# Patient Record
Sex: Male | Born: 1990
Health system: Southern US, Community
[De-identification: ages and names within clinical notes are randomized; demographics above are authoritative.]

## PROBLEM LIST (undated history)

## (undated) DIAGNOSIS — E079 Disorder of thyroid, unspecified: Secondary | ICD-10-CM

## (undated) HISTORY — DX: Disorder of thyroid, unspecified: E07.9

---

## 2012-09-02 ENCOUNTER — Encounter: Payer: Self-pay | Admitting: General Practice

## 2012-09-02 ENCOUNTER — Ambulatory Visit (INDEPENDENT_AMBULATORY_CARE_PROVIDER_SITE_OTHER): Payer: 59 | Admitting: General Practice

## 2012-09-02 ENCOUNTER — Ambulatory Visit: Payer: Self-pay | Admitting: Family Medicine

## 2012-09-02 ENCOUNTER — Ambulatory Visit: Payer: 59

## 2012-09-02 VITALS — BP 99/61 | HR 48 | Temp 97.1°F | Ht 70.0 in | Wt 168.5 lb

## 2012-09-02 DIAGNOSIS — E039 Hypothyroidism, unspecified: Secondary | ICD-10-CM

## 2012-09-02 DIAGNOSIS — T148XXA Other injury of unspecified body region, initial encounter: Secondary | ICD-10-CM

## 2012-09-02 LAB — COMPLETE METABOLIC PANEL WITH GFR
AST: 25 U/L (ref 0–37)
BUN: 14 mg/dL (ref 6–23)
Calcium: 9.2 mg/dL (ref 8.4–10.5)
Chloride: 105 mEq/L (ref 96–112)
Creat: 1.05 mg/dL (ref 0.50–1.35)
GFR, Est Non African American: >89 mL/min
Glucose, Bld: 73 mg/dL (ref 70–99)

## 2012-09-02 LAB — THYROID PANEL WITH TSH
T3 Uptake: 36.3 % (ref 22.5–37.0)
T4, Total: 5.7 ug/dL (ref 5.0–12.5)

## 2012-09-02 MED ORDER — IBUPROFEN 600 MG PO TABS: 600.0000 mg | ORAL_TABLET | Freq: Three times a day (TID) | ORAL | Status: DC | PRN

## 2012-09-02 NOTE — Progress Notes (Signed)
  Subjective:    Patient ID: Shawn Lynch, male    DOB: 11-17-1990, 22 y.o.   MRN: [id number]  HPI Patient presents today for follow up on hypothyroidism. Reports taking medication as prescribed. Reports having back spasms and muscle soreness periodically. Feels its due to physical activity (sports and exercising). He reports taking aleve and reduces the discomfort, but doesn't totally alleviate it.     Review of Systems  Constitutional: Negative for fever, chills and fatigue.  Respiratory: Negative for cough, chest tightness and shortness of breath.   Cardiovascular: Negative for chest pain and palpitations.  Gastrointestinal: Negative for abdominal pain.  Genitourinary: Negative for difficulty urinating.  Musculoskeletal: Negative.   Skin: Negative.   Neurological: Negative for dizziness, weakness and headaches.  Psychiatric/Behavioral: Negative.        Objective:   Physical Exam  Constitutional: He is oriented to person, place, and time. He appears well-developed and well-nourished.  HENT:  Head: Normocephalic and atraumatic.  Right Ear: External ear normal.  Left Ear: External ear normal.  Eyes: Conjunctivae are normal.  Neck: Normal range of motion.  Cardiovascular: Normal rate, regular rhythm and normal heart sounds.   Pulmonary/Chest: Breath sounds normal. No respiratory distress. He exhibits no tenderness.  Abdominal: Soft. Bowel sounds are normal.  Neurological: He is alert and oriented to person, place, and time.  Skin: Skin is warm and dry.  Psychiatric: He has a normal mood and affect.          Assessment & Plan:  1. Unspecified hypothyroidism - POCT CBC -labs pending - COMPLETE METABOLIC PANEL WITH GFR - Thyroid Panel With TSH  2. Muscle strain - ibuprofen (ADVIL,MOTRIN) 600 MG tablet; Take 1 tablet (600 mg total) by mouth every 8 (eight) hours as needed for pain.  Dispense: 30 tablet; Refill: 0 -instructed to apply heat pack to affected area three  times daily for 15 minutes, off 45 minutes -RTO if symptoms worsen or unresolved Patient verbalized understanding Coralie Keens, FNP-C

## 2012-09-02 NOTE — Patient Instructions (Addendum)
Hypothyroidism The thyroid is a large gland located in the lower front of your neck. The thyroid gland helps control metabolism. Metabolism is how your body handles food. It controls metabolism with the hormone thyroxine. When this gland is underactive (hypothyroid), it produces too little hormone.  CAUSES These include:   Absence or destruction of thyroid tissue.  Goiter due to iodine deficiency.  Goiter due to medications.  Congenital defects (since birth).  Problems with the pituitary. This causes a lack of TSH (thyroid stimulating hormone). This hormone tells the thyroid to turn out more hormone. SYMPTOMS  Lethargy (feeling as though you have no energy)  Cold intolerance  Weight gain (in spite of normal food intake)  Dry skin  Coarse hair  Menstrual irregularity (if severe, may lead to infertility)  Slowing of thought processes Cardiac problems are also caused by insufficient amounts of thyroid hormone. Hypothyroidism in the newborn is cretinism, and is an extreme form. It is important that this form be treated adequately and immediately or it will lead rapidly to retarded physical and mental development. DIAGNOSIS  To prove hypothyroidism, your caregiver may do blood tests and ultrasound tests. Sometimes the signs are hidden. It may be necessary for your caregiver to watch this illness with blood tests either before or after diagnosis and treatment. TREATMENT  Low levels of thyroid hormone are increased by using synthetic thyroid hormone. This is a safe, effective treatment. It usually takes about four weeks to gain the full effects of the medication. After you have the full effect of the medication, it will generally take another four weeks for problems to leave. Your caregiver may start you on low doses. If you have had heart problems the dose may be gradually increased. It is generally not an emergency to get rapidly to normal. HOME CARE INSTRUCTIONS   Take your  medications as your caregiver suggests. Let your caregiver know of any medications you are taking or start taking. Your caregiver will help you with dosage schedules.  As your condition improves, your dosage needs may increase. It will be necessary to have continuing blood tests as suggested by your caregiver.  Report all suspected medication side effects to your caregiver. SEEK MEDICAL CARE IF: Seek medical care if you develop:  Sweating.  Tremulousness (tremors).  Anxiety.  Rapid weight loss.  Heat intolerance.  Emotional swings.  Diarrhea.  Weakness. SEEK IMMEDIATE MEDICAL CARE IF:  You develop chest pain, an irregular heart beat (palpitations), or a rapid heart beat. MAKE SURE YOU:   Understand these instructions.  Will watch your condition.  Will get help right away if you are not doing well or get worse. Document Released: 03/23/2005 Document Revised: 06/15/2011 Document Reviewed: 11/11/2007 [facility] Patient Information 2014 [place], [place]. Muscle Strain Muscle strain occurs when a muscle is stretched beyond its normal length. A small number of muscle fibers generally are torn. This is especially common in athletes. This happens when a sudden, violent force placed on a muscle stretches it too far. Usually, recovery from muscle strain takes 1 to 2 weeks. Complete healing will take 5 to 6 weeks.  HOME CARE INSTRUCTIONS   While awake, apply ice to the sore muscle for the first 2 days after the injury.  Put ice in a plastic bag.  Place a towel between your skin and the bag.  Leave the ice on for 15-20 minutes each hour.  Do not use the strained muscle for several days, until you no longer have pain.  You  may wrap the injured area with an elastic bandage for comfort. Be careful not to wrap it too tightly. This may interfere with blood circulation or increase swelling.  Only take over-the-counter or prescription medicines for pain, discomfort, or fever as directed  by your caregiver. SEEK MEDICAL CARE IF:  You have increasing pain or swelling in the injured area. MAKE SURE YOU:   Understand these instructions.  Will watch your condition.  Will get help right away if you are not doing well or get worse. Document Released: 03/23/2005 Document Revised: 06/15/2011 Document Reviewed: 04/04/2011 [facility] Patient Information 2014 [place], [place].

## 2012-09-07 ENCOUNTER — Telehealth: Payer: Self-pay | Admitting: Family Medicine

## 2012-09-07 ENCOUNTER — Other Ambulatory Visit: Payer: Self-pay | Admitting: General Practice

## 2012-09-07 MED ORDER — LEVOTHYROXINE SODIUM 137 MCG PO TABS: 137.0000 ug | ORAL_TABLET | Freq: Every day | ORAL | Status: DC

## 2012-09-07 NOTE — Telephone Encounter (Signed)
Please inform patient that dose has been called in to pharmacy. Have him schedule appointment for lab draw in 6 weeks. thx

## 2012-09-07 NOTE — Telephone Encounter (Signed)
Spoke with patient.  He has been out of levothyroxine for 3 days.  Explained that TSH was slightly elevated and dosage may need to be increased. I will have Philomena Doheny, NP review the labs and make any necessary changes.  We will contact him today with recommendations.

## 2012-09-07 NOTE — Telephone Encounter (Signed)
Notified patient of dosage change.  Appt scheduled for repeat labs in 6 weeks.

## 2012-10-19 ENCOUNTER — Other Ambulatory Visit (INDEPENDENT_AMBULATORY_CARE_PROVIDER_SITE_OTHER): Payer: 59

## 2012-10-19 DIAGNOSIS — R7989 Other specified abnormal findings of blood chemistry: Secondary | ICD-10-CM

## 2012-10-19 LAB — THYROID PANEL WITH TSH: T4, Total: 5.7 ug/dL (ref 5.0–12.5)

## 2012-10-20 ENCOUNTER — Other Ambulatory Visit: Payer: Self-pay | Admitting: Nurse Practitioner

## 2012-10-20 MED ORDER — LEVOTHYROXINE SODIUM 150 MCG PO TABS: 150.0000 ug | ORAL_TABLET | Freq: Every day | ORAL | Status: DC

## 2012-11-01 ENCOUNTER — Ambulatory Visit: Payer: 59 | Admitting: Physician Assistant

## 2012-11-07 ENCOUNTER — Ambulatory Visit (INDEPENDENT_AMBULATORY_CARE_PROVIDER_SITE_OTHER): Payer: 59

## 2012-11-07 ENCOUNTER — Ambulatory Visit (INDEPENDENT_AMBULATORY_CARE_PROVIDER_SITE_OTHER): Payer: 59 | Admitting: Family Medicine

## 2012-11-07 ENCOUNTER — Telehealth: Payer: Self-pay | Admitting: General Practice

## 2012-11-07 ENCOUNTER — Encounter: Payer: Self-pay | Admitting: Family Medicine

## 2012-11-07 VITALS — BP 122/66 | HR 51 | Temp 96.7°F | Ht 69.5 in | Wt 166.2 lb

## 2012-11-07 DIAGNOSIS — S6980XA Other specified injuries of unspecified wrist, hand and finger(s), initial encounter: Secondary | ICD-10-CM

## 2012-11-07 DIAGNOSIS — S6991XA Unspecified injury of right wrist, hand and finger(s), initial encounter: Secondary | ICD-10-CM

## 2012-11-07 NOTE — Patient Instructions (Signed)
Thumb Fracture  There are many types of thumb fractures (breaks). There are different ways of treating these fractures, all of which may be correct, varying from case to case. Your caregiver will discuss different ways to treat these fractures with you. TREATMENT   Immobilization. This means the fracture is casted as it is without changing the positions of the fracture (bone pieces) involved. This fracture is casted in a "thumb spica" also called a hitchhiker cast. It is generally left on for 2 to 6 weeks.  Closed reduction. The bones are manipulated back into position without using surgery.  ORIF (open reduction and internal fixation). The fracture site is opened and the bone pieces are fixed into place with some type of hardware such as screws or wires. Your caregiver will discuss the type of fracture you have and the treatment that will be best for that problem. If surgery is the treatment of choice, the following is information for you to know and to let your caregiver know about prior to surgery. LET YOUR CAREGIVERS KNOW ABOUT:  Allergies.  Medications taken including herbs, eye drops, over the counter medications, and creams.  Use of steroids (by mouth or creams).  Previous problems with anesthetics or Novocain.  Family history of anesthetic complications..  Possibility of pregnancy, if this applies.  History of blood clots (thrombophlebitis).  History of bleeding or blood problems.  Previous surgery.  Other health problems. AFTER THE PROCEDURE  After surgery, you will be taken to the recovery area. A nurse will watch and check your progress. Once you are awake, stable, and taking fluids well, barring other problems you will be allowed to go home. Once home, an ice pack applied to your operative site may help with discomfort and keep the swelling down. Elevate your hand above your heart as much as possible for the first 4-5 days after the injury/surgery. HOME CARE INSTRUCTIONS     Follow your caregiver's instructions as to activities, exercises, physical therapy, and driving a car.  Use thumb and exercise as directed.  Only take over-the-counter or prescription medicines for pain, discomfort, or fever as directed by your caregiver. Do not take aspirin until your caregiver instructs. This can increase bleeding immediately following surgery. SEEK MEDICAL CARE IF:   There is increased bleeding (more than a small spot) from the wound or from beneath your cast or splint.  There is redness, swelling, or increasing pain in the wound or from beneath your cast or splint.  You have pus coming from wound or from beneath your cast or splint.  An unexplained oral temperature above 102 F (38.9 C) develops.  There is a foul smell coming from the wound or dressing or from beneath your cast or splint. SEEK IMMEDIATE MEDICAL CARE IF:   You develop severe pain, decreased sensation such as numbness or tingling.  You develop a rash.  You have difficulty breathing.  Youhave any allergic problems. If you do not have a window in your cast for observing the wound, a discharge or minor bleeding may show up as a stain on the outside of your cast. Report these findings to your caregiver. If you have a removable splint overlying the surgical dressings it is common to see a small amount of bleeding. Change the dressings as instructed by your caregiver. Document Released: 12/20/2002 Document Revised: 06/15/2011 Document Reviewed: 08/01/2007 [employer] Patient Information 2014 [employer].  

## 2012-11-07 NOTE — Progress Notes (Signed)
  Subjective:    Patient ID: Shawn Lynch, male    DOB: 1991/02/27, 22 y.o.   MRN: [id number]  HPI This 22 y.o. male presents for evaluation of right thumb injury.  Report from xray done earlier Shows he has a proximal thumb fx and he has been having discomfort and swelling.  He was Playing soccer and fell on his right hand and his thumb bent all the way back.   Review of Systems No chest pain, SOB, HA, dizziness, vision change, N/V, diarrhea, constipation, dysuria, urinary urgency or frequency, myalgias, arthralgias or rash.     Objective:   Physical Exam Vital signs noted  Well developed well nourished male.  HEENT - Head atraumatic Normocephalic                Eyes - PERRLA, Conjuctiva - clear Sclera- Clear EOMI                Ears - EAC's Wnl TM's Wnl Gross Hearing WNL                Nose - Nares patent                 Throat - oropharanx wnl Respiratory - Lungs CTA bilateral Cardiac - RRR S1 and S2 without murmur MS - Right thumb swelling and ecchymosis at thenar eminence, TTP Proximal phalanx And MTP joint.       Assessment & Plan:  Thumb injury, right, initial encounter - Plan: DG Finger Thumb Right  Fracture Right Thumb  - Spica splint applied right thumb and orthopedic consult.

## 2012-11-07 NOTE — Telephone Encounter (Signed)
appt made

## 2012-11-09 ENCOUNTER — Telehealth: Payer: Self-pay | Admitting: General Practice

## 2012-11-09 NOTE — Telephone Encounter (Signed)
Rx ready, pt aware.

## 2012-11-21 ENCOUNTER — Telehealth: Payer: Self-pay | Admitting: General Practice

## 2012-11-21 ENCOUNTER — Ambulatory Visit (INDEPENDENT_AMBULATORY_CARE_PROVIDER_SITE_OTHER): Payer: 59 | Admitting: Nurse Practitioner

## 2012-11-21 ENCOUNTER — Ambulatory Visit (INDEPENDENT_AMBULATORY_CARE_PROVIDER_SITE_OTHER): Payer: 59

## 2012-11-21 ENCOUNTER — Encounter: Payer: Self-pay | Admitting: Nurse Practitioner

## 2012-11-21 VITALS — BP 120/73 | HR 54 | Temp 97.0°F | Ht 69.5 in | Wt 174.0 lb

## 2012-11-21 DIAGNOSIS — E039 Hypothyroidism, unspecified: Secondary | ICD-10-CM

## 2012-11-21 DIAGNOSIS — R110 Nausea: Secondary | ICD-10-CM

## 2012-11-21 DIAGNOSIS — R0602 Shortness of breath: Secondary | ICD-10-CM

## 2012-11-21 LAB — POCT CBC
Hemoglobin: 14.8 g/dL (ref 14.1–18.1)
Lymph, poc: 2.1 (ref 0.6–3.4)
MCH, POC: 27.8 pg (ref 27–31.2)
MCHC: 34 g/dL (ref 31.8–35.4)
MPV: 8.2 fL (ref 0–99.8)
POC Granulocyte: 4.3 (ref 2–6.9)
POC LYMPH PERCENT: 31.4 %L (ref 10–50)
Platelet Count, POC: 159 10*3/uL (ref 142–424)
RBC: 5.3 M/uL (ref 4.69–6.13)

## 2012-11-21 MED ORDER — OMEPRAZOLE 20 MG PO CPDR: 20.0000 mg | DELAYED_RELEASE_CAPSULE | Freq: Every day | ORAL | Status: DC

## 2012-11-21 NOTE — Patient Instructions (Signed)
Gastroesophageal Reflux Disease, Adult  Gastroesophageal reflux disease (GERD) happens when acid from your stomach flows up into the esophagus. When acid comes in contact with the esophagus, the acid causes soreness (inflammation) in the esophagus. Over time, GERD may create small holes (ulcers) in the lining of the esophagus.  CAUSES   · Increased body weight. This puts pressure on the stomach, making acid rise from the stomach into the esophagus.  · Smoking. This increases acid production in the stomach.  · Drinking alcohol. This causes decreased pressure in the lower esophageal sphincter (valve or ring of muscle between the esophagus and stomach), allowing acid from the stomach into the esophagus.  · Late evening meals and a full stomach. This increases pressure and acid production in the stomach.  · A malformed lower esophageal sphincter.  Sometimes, no cause is found.  SYMPTOMS   · Burning pain in the lower part of the mid-chest behind the breastbone and in the mid-stomach area. This may occur twice a week or more often.  · Trouble swallowing.  · Sore throat.  · Dry cough.  · Asthma-like symptoms including chest tightness, shortness of breath, or wheezing.  DIAGNOSIS   Your caregiver may be able to diagnose GERD based on your symptoms. In some cases, X-rays and other tests may be done to check for complications or to check the condition of your stomach and esophagus.  TREATMENT   Your caregiver may recommend over-the-counter or prescription medicines to help decrease acid production. Ask your caregiver before starting or adding any new medicines.   HOME CARE INSTRUCTIONS   · Change the factors that you can control. Ask your caregiver for guidance concerning weight loss, quitting smoking, and alcohol consumption.  · Avoid foods and drinks that make your symptoms worse, such as:  · Caffeine or alcoholic drinks.  · Chocolate.  · Peppermint or mint flavorings.  · Garlic and onions.  · Spicy foods.  · Citrus fruits,  such as oranges, lemons, or limes.  · Tomato-based foods such as sauce, chili, salsa, and pizza.  · Fried and fatty foods.  · Avoid lying down for the 3 hours prior to your bedtime or prior to taking a nap.  · Eat small, frequent meals instead of large meals.  · Wear loose-fitting clothing. Do not wear anything tight around your waist that causes pressure on your stomach.  · Raise the head of your bed 6 to 8 inches with wood blocks to help you sleep. Extra pillows will not help.  · Only take over-the-counter or prescription medicines for pain, discomfort, or fever as directed by your caregiver.  · Do not take aspirin, ibuprofen, or other nonsteroidal anti-inflammatory drugs (NSAIDs).  SEEK IMMEDIATE MEDICAL CARE IF:   · You have pain in your arms, neck, jaw, teeth, or back.  · Your pain increases or changes in intensity or duration.  · You develop nausea, vomiting, or sweating (diaphoresis).  · You develop shortness of breath, or you faint.  · Your vomit is green, yellow, black, or looks like coffee grounds or blood.  · Your stool is red, bloody, or black.  These symptoms could be signs of other problems, such as heart disease, gastric bleeding, or esophageal bleeding.  MAKE SURE YOU:   · Understand these instructions.  · Will watch your condition.  · Will get help right away if you are not doing well or get worse.  Document Released: 12/31/2004 Document Revised: 06/15/2011 Document Reviewed: 10/10/2010  [employer]® Patient   Information ©2014 [employer].

## 2012-11-21 NOTE — Progress Notes (Signed)
  Subjective:    Patient ID: Maceo Pro, male    DOB: 12/14/1990, 22 y.o.   MRN: [id number]  HPI Patient in c/o nausea for the last 2-3 weeks- Usually occurs in the morning and at night- eating doesn't help nausea- some things can make it worse- like greasy foods make it worse. SOB for the last 4 days- feels like he just can't get enough air- feels like heaviness on his chest.    Review of Systems  Constitutional: Negative for fever, appetite change and fatigue.  Respiratory: Positive for shortness of breath.   Cardiovascular: Positive for chest pain.  Gastrointestinal: Positive for nausea.  Genitourinary: Negative.   Musculoskeletal: Negative.   Skin: Negative.        Objective:   Physical Exam  Constitutional: He appears well-developed and well-nourished.  Cardiovascular: Normal rate, regular rhythm and normal heart sounds.   Pulmonary/Chest: Effort normal and breath sounds normal. No respiratory distress. He has no wheezes. He has no rales. He exhibits no tenderness.  Abdominal: Soft. Bowel sounds are normal.  Neurological: He is alert.  Skin: Skin is warm.   BP 120/73  Pulse 54  Temp(Src) 97 F (36.1 C) (Oral)  Ht 5' 9.5" (1.765 m)  Wt 174 lb (78.926 kg)  BMI 25.34 kg/m2  SpO2 98% ekg- sinus bradycardia- patient is a runner- Human resources officer, FNP  .chest x ray- wnl-Preliminary reading by Paulene Floor, FNP  [facility] CBC- WNL        Assessment & Plan:  1. SOB (shortness of breath) DEEP BREATHING EXERCISES - DG Chest 2 View; Future - EKG 12-Lead 2. Nausea alone AVOID SPICY AND FATTY FOODS WILL TALK ONCE LABS ARE BACK - POCT CBC - BMP8+EGFR - H Pylori, IGM, IGG, IGA AB  Mary-Margaret Daphine Deutscher, FNP

## 2012-11-21 NOTE — Telephone Encounter (Signed)
appt given for today 

## 2012-11-24 LAB — BMP8+EGFR
CO2: 28 mmol/L (ref 18–29)
Calcium: 9.6 mg/dL (ref 8.7–10.2)
Creatinine, Ser: 1.05 mg/dL (ref 0.76–1.27)
GFR calc non Af Amer: 102 mL/min/{1.73_m2} (ref >59)
Glucose: 87 mg/dL (ref 65–99)
Potassium: 4.5 mmol/L (ref 3.5–5.2)
Sodium: 139 mmol/L (ref 134–144)

## 2012-11-24 LAB — H PYLORI, IGM, IGG, IGA AB: H Pylori IgG: 0.9 U/mL — ABNORMAL HIGH (ref 0.0–0.8)

## 2012-11-28 ENCOUNTER — Encounter: Payer: Self-pay | Admitting: *Deleted

## 2013-03-06 ENCOUNTER — Ambulatory Visit: Payer: 59 | Admitting: General Practice

## 2013-03-10 ENCOUNTER — Ambulatory Visit: Payer: 59 | Admitting: Family Medicine

## 2013-06-26 ENCOUNTER — Other Ambulatory Visit: Payer: Self-pay | Admitting: Nurse Practitioner

## 2013-06-27 NOTE — Telephone Encounter (Signed)
Patient NTBS for follow up and lab work  

## 2013-06-27 NOTE — Telephone Encounter (Signed)
Thyroid panel 7/14 was suppose to return in 6 weeks. Did not come back for labs. Please review.

## 2013-06-28 ENCOUNTER — Other Ambulatory Visit: Payer: Self-pay | Admitting: *Deleted

## 2013-06-28 NOTE — Telephone Encounter (Signed)
Patient NTBS for follow up and lab work - no refill on levothyroxin until seen for labs

## 2013-06-28 NOTE — Telephone Encounter (Signed)
Patient last had TSH drawn on 10-19-12. Was to return in 6 weeks for a recheck but not done. Please advise on rf

## 2013-06-29 NOTE — Telephone Encounter (Signed)
Patient needs to have labwork before running out of medication though otherwise the labwork will not be accurate.  Tried to contact patient but there was no answer and his voicemail was not setup. Will attempt again later.

## 2013-07-03 ENCOUNTER — Telehealth: Payer: Self-pay | Admitting: Family Medicine

## 2013-07-03 MED ORDER — LEVOTHYROXINE SODIUM 150 MCG PO TABS: 150.0000 ug | ORAL_TABLET | Freq: Every day | ORAL | Status: DC

## 2013-07-03 NOTE — Telephone Encounter (Signed)
done

## 2013-08-03 ENCOUNTER — Ambulatory Visit (INDEPENDENT_AMBULATORY_CARE_PROVIDER_SITE_OTHER): Payer: 59 | Admitting: Nurse Practitioner

## 2013-08-03 ENCOUNTER — Ambulatory Visit (INDEPENDENT_AMBULATORY_CARE_PROVIDER_SITE_OTHER): Payer: 59

## 2013-08-03 ENCOUNTER — Encounter: Payer: Self-pay | Admitting: *Deleted

## 2013-08-03 ENCOUNTER — Encounter: Payer: Self-pay | Admitting: Nurse Practitioner

## 2013-08-03 VITALS — BP 114/69 | HR 54 | Temp 97.2°F | Ht 70.0 in | Wt 152.0 lb

## 2013-08-03 DIAGNOSIS — S6990XA Unspecified injury of unspecified wrist, hand and finger(s), initial encounter: Secondary | ICD-10-CM

## 2013-08-03 DIAGNOSIS — S0011XA Contusion of right eyelid and periocular area, initial encounter: Secondary | ICD-10-CM

## 2013-08-03 DIAGNOSIS — S6991XA Unspecified injury of right wrist, hand and finger(s), initial encounter: Secondary | ICD-10-CM

## 2013-08-03 DIAGNOSIS — S62309A Unspecified fracture of unspecified metacarpal bone, initial encounter for closed fracture: Secondary | ICD-10-CM

## 2013-08-03 DIAGNOSIS — S0510XA Contusion of eyeball and orbital tissues, unspecified eye, initial encounter: Secondary | ICD-10-CM

## 2013-08-03 DIAGNOSIS — S62306A Unspecified fracture of fifth metacarpal bone, right hand, initial encounter for closed fracture: Secondary | ICD-10-CM

## 2013-08-03 NOTE — Patient Instructions (Signed)
Hand Fracture, Fifth Metacarpal The small metacarpal is the bone at the base of the little finger between the knuckle and the wrist. A fracture is a break in that bone. One of the fractures that is common to this bone is called a Boxer's Fracture. TREATMENT These fractures can be treated with:   Reduction (bones moved back into place), then pinned through the skin to maintain the position, and then casted for about 6 weeks or as your caregiver determines necessary.  ORIF (open reduction and internal fixation) - the fracture site is opened and the bone pieces are fixed into place with pins and then casted for approximately 6 weeks or as your caregiver determines necessary. Your caregiver will discuss the type of fracture you have and the treatment that should be best for that problem. If surgery is the treatment of choice, the following is information for you to know, and also let your caregiver know about prior to surgery.  LET YOUR CAREGIVER KNOW ABOUT:  Allergies.  Medications taken including herbs, eye drops, over the counter medications, and creams.  Use of steroids (by mouth or creams).  Previous problems with anesthetics or novocaine.  Possibility of pregnancy, if this applies.  History of blood clots (thrombophlebitis).  History of bleeding or blood problems.  Previous surgery.  Other health problems. AFTER THE PROCEDURE After surgery, you will be taken to the recovery area where a nurse will watch and check your progress. Once you're awake, stable, and taking fluids well, barring other problems you'll be allowed to go home. Once home an ice pack applied to your operative site may help with discomfort and keep the swelling down. HOME CARE INSTRUCTIONS   Follow your caregiver's instructions as to activities, exercises, physical therapy, and driving a car.  Daily exercise is helpful for maintaining range of motion (movement and mobility) and strength. Exercise as  instructed.  To lessen swelling, keep the injured hand elevated above the level of your heart as much as possible.  Apply ice to the injury for 15-20 minutes each hour while awake for the first 2 days. Put the ice in a plastic bag and place a thin towel between the bag of ice and your cast.  Move the fingers of your casted hand at least several times a day.  If a plaster or fiberglass cast was applied:  Do not try to scratch the skin under the cast using a sharp or pointed object.  Check the skin around the cast every day. You may put lotion on red or sore areas.  Keep your cast dry. Your cast can be protected during bathing with a plastic bag. Do not put your cast into the water.  If a plaster splint was applied:  Wear the splint for as long as directed by your caregiver or until seen for follow-up examination.  Do not get your splint wet. Protect it during bathing with a plastic bag.  You may loosen the elastic bandage around the splint if your fingers start to get numb, tingle, get cold or turn blue.  Do not put pressure on your cast or splint; this may cause it to break. Especially, do not lean plaster casts on hard surfaces for 24 hours after application.  Take medications as directed by your caregiver.  Only take over-the-counter or prescription medicines for pain, discomfort, or fever as directed by your caregiver.  Follow all instructions for physician referrals, physical therapy, and rehabilitation. Any delay in obtaining necessary care could result in   take over-the-counter or prescription medicines for pain, discomfort, or fever as directed by your caregiver.   Follow all instructions for physician referrals, physical therapy, and rehabilitation. Any delay in obtaining necessary care could result in permanent injury, disability and chronic pain.  SEEK MEDICAL CARE IF:    Increased bleeding (more than a small spot) from the wound or from beneath your cast or splint if there is a wound beneath the cast from surgery.   Redness, swelling, or increasing pain in the wound or from beneath your cast or splint.   Pus coming from wound or from beneath your cast or splint.   An unexplained oral temperature above 102 F (38.9 C)  develops.   A foul smell coming from the wound or dressing or from beneath your cast or splint.   You are unable to move your little finger.  SEEK IMMEDIATE MEDICAL CARE IF:   You develop a rash, have difficulty breathing, or have any allergy problems.  If you do not have a window in your cast for observing the wound, a discharge or minor bleeding may show up as a stain on the outside of your cast. Report these findings to your caregiver.  MAKE SURE YOU:    Understand these instructions.   Will watch your condition.   Will get help right away if you are not doing well or get worse.  Document Released: 06/29/2000 Document Revised: 06/15/2011 Document Reviewed: 11/10/2007  [employer] Patient Information 2014 [employer].

## 2013-08-03 NOTE — Progress Notes (Signed)
   Subjective:    Patient ID: Shawn Lynch, male    DOB: 16-Jun-1990, 23 y.o.   MRN: [id number]  HPI Was playing basketball yesterday and went up for a lay up and fell and hit his face and right hand on floor. Has black eye and hand hurts to move.    Review of Systems  Constitutional: Negative.   HENT: Negative.   Eyes: Negative for photophobia and visual disturbance.  Respiratory: Negative.   Neurological: Negative for dizziness, light-headedness and headaches.  All other systems reviewed and are negative.      Objective:   Physical Exam  Constitutional: He appears well-developed and well-nourished.  Eyes:  Right upper and lower lid echymosis with edema  Cardiovascular: Normal rate, regular rhythm and normal heart sounds.   Pulmonary/Chest: Effort normal and breath sounds normal.  Musculoskeletal:  Pain in right hand on palpation at base of 5th finger- pain on full extension og fingers.  Skin: Skin is warm and dry.  Psychiatric: He has a normal mood and affect. His behavior is normal. Judgment and thought content normal.    BP 114/69  Pulse 54  Temp(Src) 97.2 F (36.2 C) (Oral)  Ht 5\' 10"  (1.778 m)  Wt 152 lb (68.947 kg)  BMI 21.81 kg/m2  Right hand x ray- displaced fracture of distal head of 5th Ned Gracemetacarpal-Mary-Margaret Gray Maugeri, FNP      Assessment & Plan:   1. Injury of right hand   2. Fracture of fifth metacarpal bone of right hand   displaced 3. echymosis of right eyelid   Orders Placed This Encounter  Procedures  . DG Hand Complete Right    Standing Status: Future     Number of Occurrences: 1     Standing Expiration Date: 10/03/2014    Order Specific Question:  Reason for Exam (SYMPTOM  OR DIAGNOSIS REQUIRED)    Answer:  right hand injury    Order Specific Question:  Preferred imaging location?    Answer:  Internal  . Ambulatory referral to Orthopedic Surgery    Referral Priority:  Routine    Referral Type:  Surgical    Referral Reason:  Specialty  Services Required    Requested Specialty:  Orthopedic Surgery    Number of Visits Requested:  1   Wear splint that was applied ( Alumnafoam ) until seen by ortho Motrin OTC Q 6 for pain Ice to black eye Mary-Margaret Daphine DeutscherMartin, FNP

## 2013-10-23 ENCOUNTER — Encounter: Payer: Self-pay | Admitting: Family Medicine

## 2013-10-23 ENCOUNTER — Ambulatory Visit (INDEPENDENT_AMBULATORY_CARE_PROVIDER_SITE_OTHER): Payer: 59 | Admitting: Family Medicine

## 2013-10-23 ENCOUNTER — Encounter (INDEPENDENT_AMBULATORY_CARE_PROVIDER_SITE_OTHER): Payer: Self-pay

## 2013-10-23 VITALS — BP 115/66 | HR 55 | Temp 96.6°F | Ht 70.0 in | Wt 178.6 lb

## 2013-10-23 DIAGNOSIS — K5289 Other specified noninfective gastroenteritis and colitis: Secondary | ICD-10-CM

## 2013-10-23 NOTE — Progress Notes (Signed)
   Subjective:    Patient ID: Shawn Lynch, male    DOB: 11-Mar-1991, 23 y.o.   MRN: [id number]  HPI  Had AGE over Weekend and missed work on 10/22/13 and needs work note. He states sx's have resolved  Review of Systems    No chest pain, SOB, HA, dizziness, vision change, N/V, diarrhea, constipation, dysuria, urinary urgency or frequency, myalgias, arthralgias or rash.  Objective:   Physical Exam Vital signs noted  Well developed well nourished male.  HEENT - Head atraumatic Normocephalic                Eyes - PERRLA, Conjuctiva - clear Sclera- Clear EOMI                Ears - EAC's Wnl TM's Wnl Gross Hearing WNL                Nose - Nares patent                 Throat - oropharanx wnl Respiratory - Lungs CTA bilateral Cardiac - RRR S1 and S2 without murmur GI - Abdomen soft Nontender and bowel sounds active x 4 Extremities - No edema. Neuro - Grossly intact.       Assessment & Plan:  Gastroenteritis - Push po fluids, rest and note for work given.  Deatra CanterWilliam J Shayona Hibbitts FNP

## 2013-10-24 ENCOUNTER — Other Ambulatory Visit: Payer: Self-pay | Admitting: Nurse Practitioner

## 2013-11-02 ENCOUNTER — Other Ambulatory Visit: Payer: Self-pay | Admitting: Nurse Practitioner

## 2013-11-02 ENCOUNTER — Telehealth: Payer: Self-pay | Admitting: Nurse Practitioner

## 2013-11-02 MED ORDER — LEVOTHYROXINE SODIUM 150 MCG PO TABS: 150.0000 ug | ORAL_TABLET | Freq: Every day | ORAL | Status: DC

## 2013-11-28 IMAGING — CR DG CHEST 2V
2 series · 2 of 2 positions shown · non-contrast
Comparison: None.

CLINICAL DATA: Shortness of breath

CHEST - 2 VIEW

[view not recorded (1 of 2)]
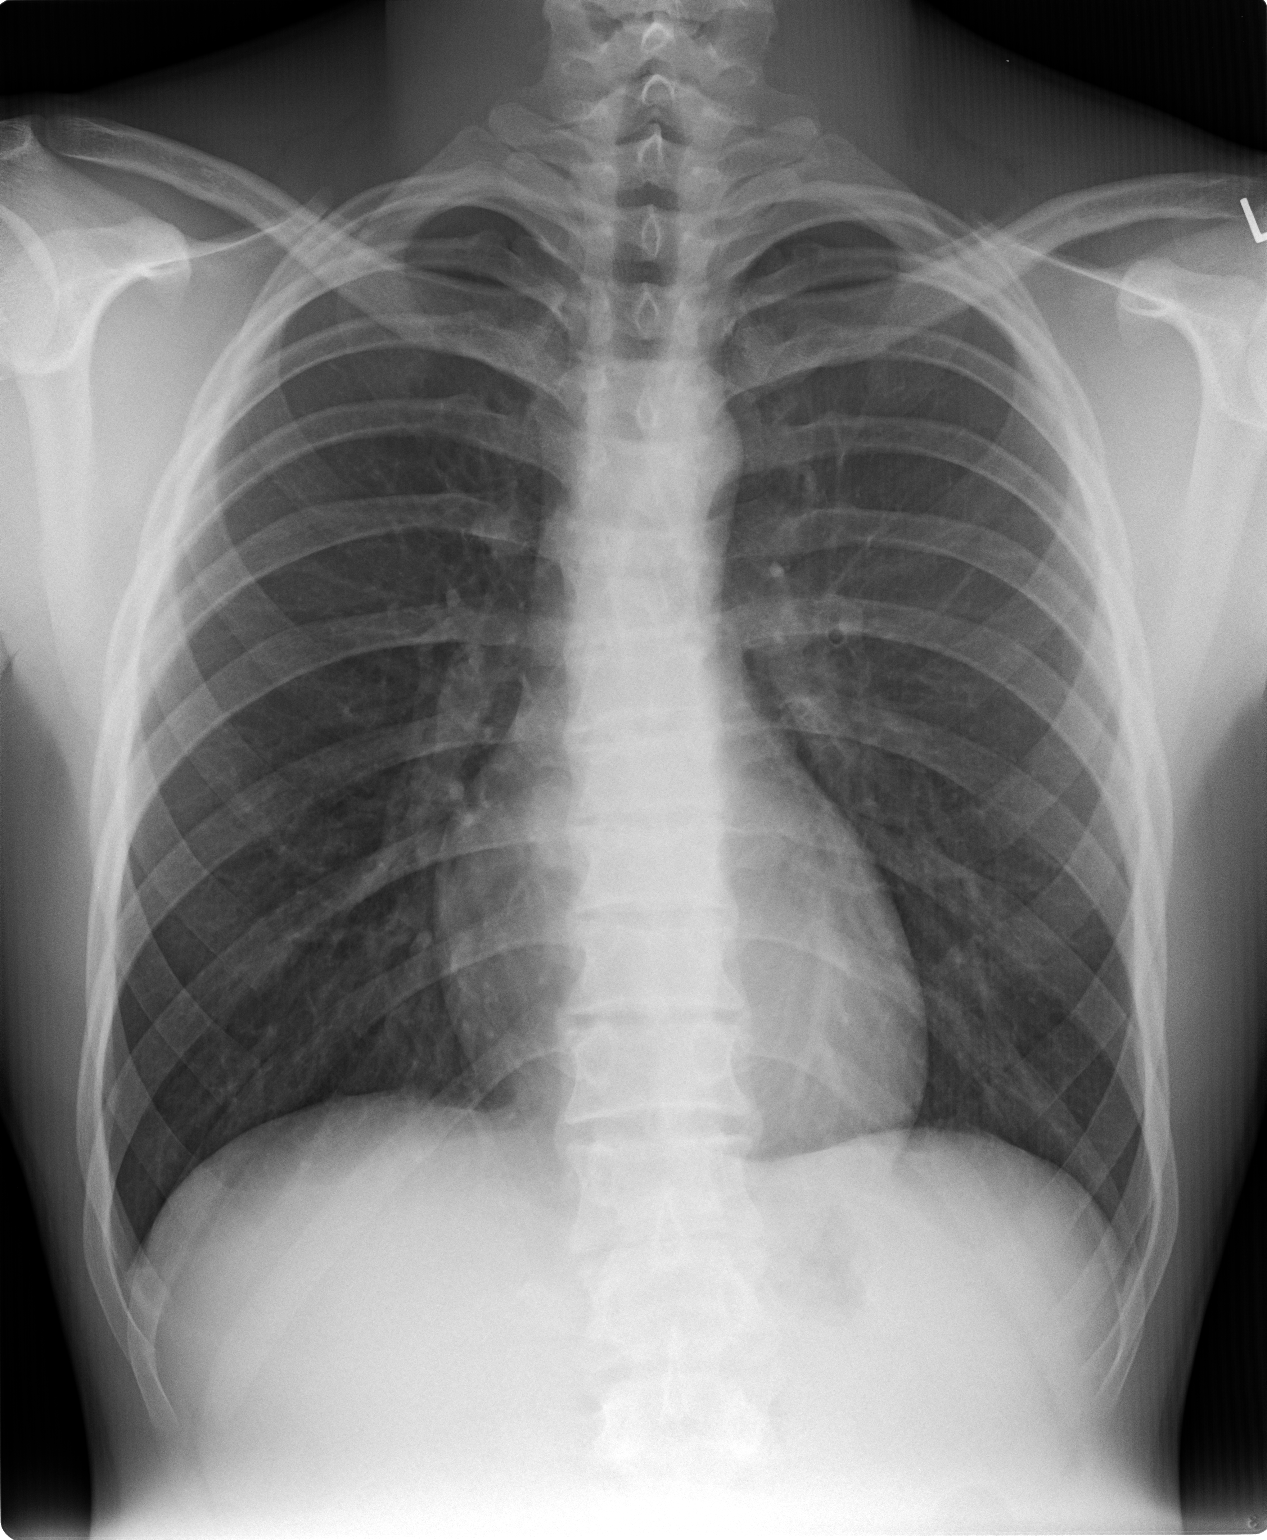

[view not recorded (2 of 2)]
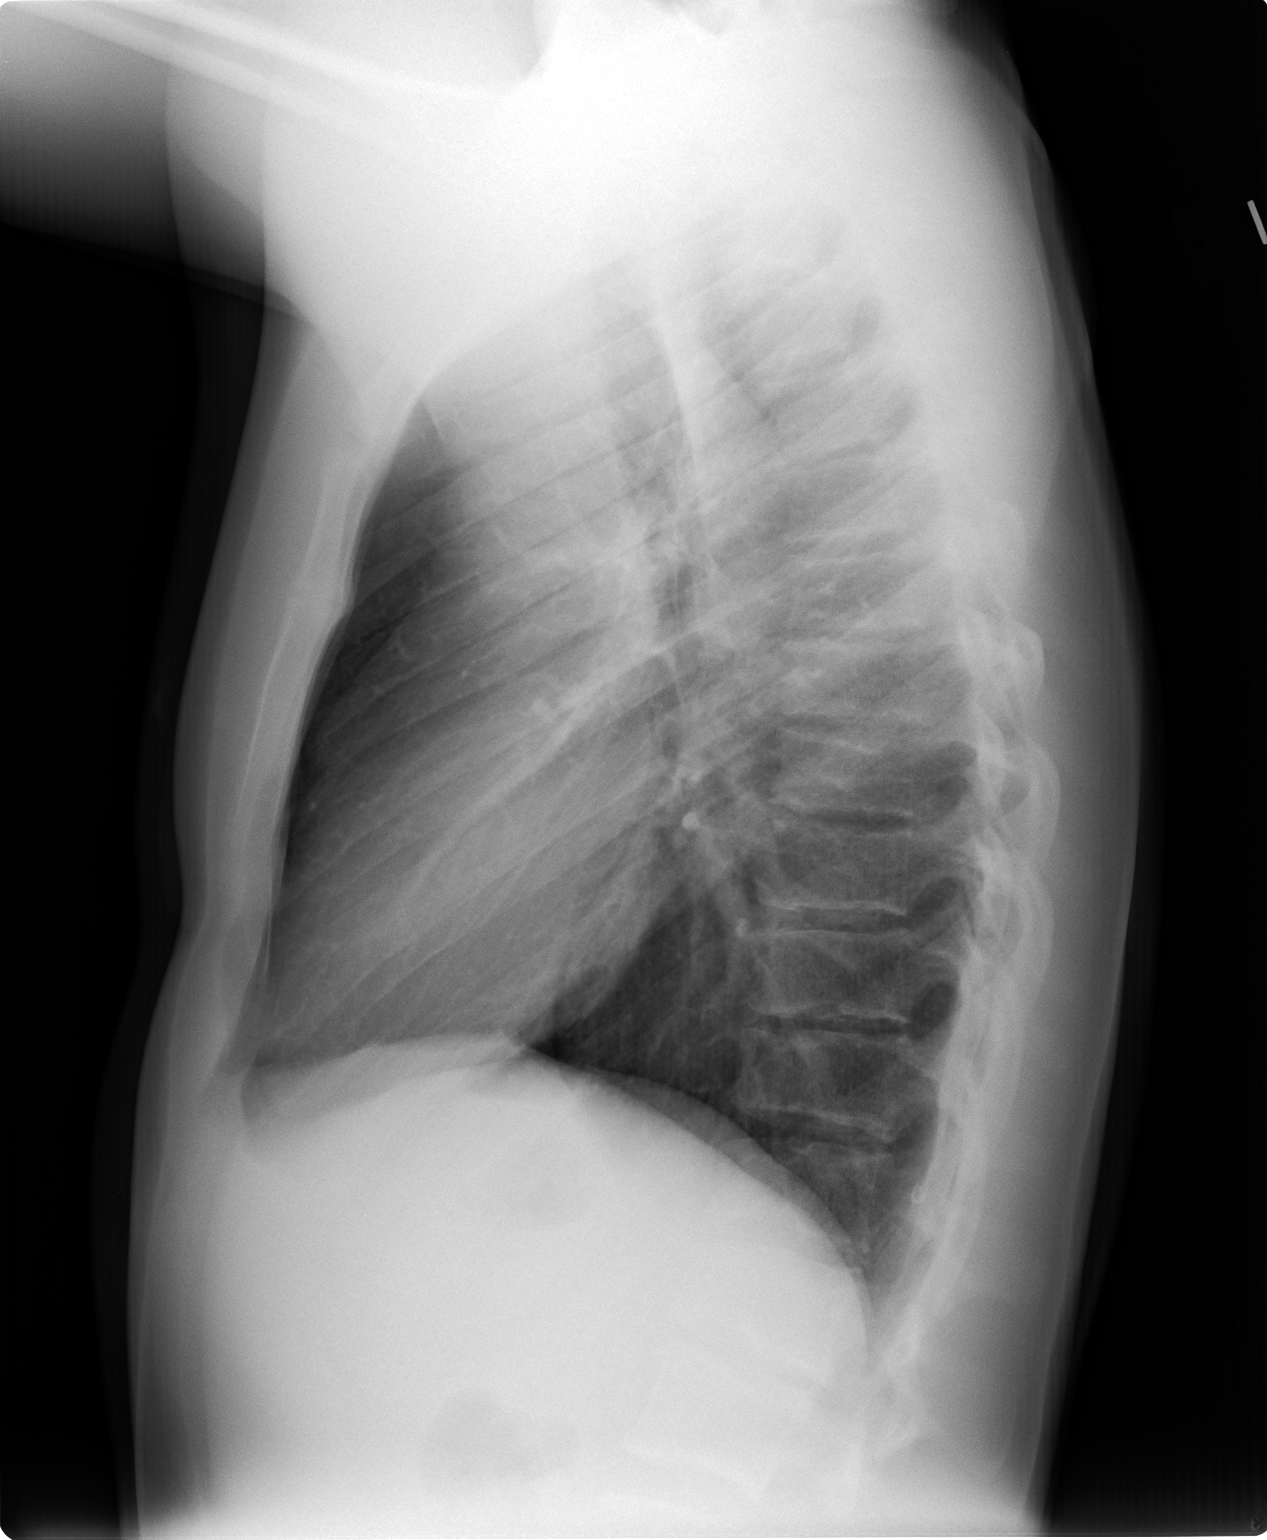

[2 of 2 positions shown; findings below may reference images not displayed]

FINDINGS: Cardiomediastinal silhouette appears normal.  No acute
pulmonary disease is noted.  Bony thorax is intact.
IMPRESSION: No acute cardiopulmonary abnormality seen.

Clinically significant discrepancy from primary report, if
provided: None

## 2013-12-01 ENCOUNTER — Telehealth: Payer: Self-pay | Admitting: Nurse Practitioner

## 2013-12-01 ENCOUNTER — Other Ambulatory Visit (INDEPENDENT_AMBULATORY_CARE_PROVIDER_SITE_OTHER): Payer: 59

## 2013-12-01 DIAGNOSIS — E039 Hypothyroidism, unspecified: Secondary | ICD-10-CM

## 2013-12-01 NOTE — Progress Notes (Signed)
Pt came in for lab  only 

## 2013-12-02 LAB — THYROID PANEL WITH TSH
FREE THYROXINE INDEX: 2.3 (ref 1.2–4.9)
T3 UPTAKE RATIO: 31 % (ref 24–39)
T4, Total: 7.3 ug/dL (ref 4.5–12.0)
TSH: 0.739 u[IU]/mL (ref 0.450–4.500)

## 2013-12-04 MED ORDER — LEVOTHYROXINE SODIUM 150 MCG PO TABS: 150.0000 ug | ORAL_TABLET | Freq: Every day | ORAL | Status: DC

## 2013-12-04 NOTE — Telephone Encounter (Signed)
done

## 2013-12-07 ENCOUNTER — Telehealth: Payer: Self-pay | Admitting: Nurse Practitioner

## 2013-12-07 NOTE — Telephone Encounter (Signed)
error 

## 2013-12-18 ENCOUNTER — Ambulatory Visit (INDEPENDENT_AMBULATORY_CARE_PROVIDER_SITE_OTHER): Payer: 59 | Admitting: Family Medicine

## 2013-12-18 ENCOUNTER — Encounter: Payer: Self-pay | Admitting: Family Medicine

## 2013-12-18 VITALS — BP 117/64 | HR 51 | Temp 97.1°F | Ht 70.0 in | Wt 175.2 lb

## 2013-12-18 DIAGNOSIS — L309 Dermatitis, unspecified: Secondary | ICD-10-CM

## 2013-12-18 DIAGNOSIS — L259 Unspecified contact dermatitis, unspecified cause: Secondary | ICD-10-CM

## 2013-12-18 MED ORDER — METHYLPREDNISOLONE ACETATE 80 MG/ML IJ SUSP
80.0000 mg | Freq: Once | INTRAMUSCULAR | Status: AC
  Administered 2013-12-18: 80 mg via INTRAMUSCULAR

## 2013-12-18 MED ORDER — HYDROXYZINE HCL 25 MG PO TABS: 25.0000 mg | ORAL_TABLET | Freq: Three times a day (TID) | ORAL | Status: DC | PRN

## 2013-12-18 MED ORDER — METHYLPREDNISOLONE ACETATE 80 MG/ML IJ SUSP: 80.0000 mg | Freq: Once | INTRAMUSCULAR | Status: DC

## 2013-12-18 NOTE — Progress Notes (Signed)
   Subjective:    Patient ID: Shawn Lynch, male    DOB: 02-11-1991, 23 y.o.   MRN: [id number]  HPI This 23 y.o. male presents for evaluation of rash on left leg and abdomen that itches.  He may have come into contact with poison ivy.   Review of Systems C/o rash No chest pain, SOB, HA, dizziness, vision change, N/V, diarrhea, constipation, dysuria, urinary urgency or frequency, myalgias, arthralgias.     Objective:   Physical Exam Vital signs noted  Well developed well nourished male.  HEENT - Head atraumatic Normocephalic Respiratory - Lungs CTA bilateral Cardiac - RRR S1 and S2 without murmur Skin - erythematous raised rash left leg and abdomen.      Assessment & Plan:  Dermatitis - Plan: hydrOXYzine (ATARAX/VISTARIL) 25 MG tablet, methylPREDNISolone acetate (DEPO-MEDROL) injection 80 mg, DISCONTINUED: methylPREDNISolone acetate (DEPO-MEDROL) injection 80 mg  Deatra Canter FNP

## 2014-07-24 ENCOUNTER — Other Ambulatory Visit: Payer: Self-pay | Admitting: Nurse Practitioner

## 2015-02-13 ENCOUNTER — Other Ambulatory Visit: Payer: Self-pay | Admitting: Nurse Practitioner

## 2016-03-05 ENCOUNTER — Other Ambulatory Visit: Payer: Self-pay | Admitting: Nurse Practitioner

## 2016-04-28 ENCOUNTER — Ambulatory Visit (INDEPENDENT_AMBULATORY_CARE_PROVIDER_SITE_OTHER): Payer: 59 | Admitting: Nurse Practitioner

## 2016-04-28 ENCOUNTER — Encounter: Payer: Self-pay | Admitting: Nurse Practitioner

## 2016-04-28 VITALS — BP 109/64 | HR 79 | Temp 97.1°F | Ht 70.0 in | Wt 196.0 lb

## 2016-04-28 DIAGNOSIS — E039 Hypothyroidism, unspecified: Secondary | ICD-10-CM | POA: Diagnosis not present

## 2016-04-28 MED ORDER — LEVOTHYROXINE SODIUM 150 MCG PO TABS: 150.0000 ug | ORAL_TABLET | Freq: Every day | ORAL | 2 refills | Status: DC

## 2016-04-28 NOTE — Patient Instructions (Signed)
Hypothyroidism Hypothyroidism is a disorder of the thyroid. The thyroid is a large gland that is located in the lower front of the neck. The thyroid releases hormones that control how the body works. With hypothyroidism, the thyroid does not make enough of these hormones. What are the causes? Causes of hypothyroidism may include:  Viral infections.  Pregnancy.  Your own defense system (immune system) attacking your thyroid.  Certain medicines.  Birth defects.  Past radiation treatments to your head or neck.  Past treatment with radioactive iodine.  Past surgical removal of part or all of your thyroid.  Problems with the gland that is located in the center of your brain (pituitary).  What are the signs or symptoms? Signs and symptoms of hypothyroidism may include:  Feeling as though you have no energy (lethargy).  Inability to tolerate cold.  Weight gain that is not explained by a change in diet or exercise habits.  Dry skin.  Coarse hair.  Menstrual irregularity.  Slowing of thought processes.  Constipation.  Sadness or depression.  How is this diagnosed? Your health care provider may diagnose hypothyroidism with blood tests and ultrasound tests. How is this treated? Hypothyroidism is treated with medicine that replaces the hormones that your body does not make. After you begin treatment, it may take several weeks for symptoms to go away. Follow these instructions at home:  Take medicines only as directed by your health care provider.  If you start taking any new medicines, tell your health care provider.  Keep all follow-up visits as directed by your health care provider. This is important. As your condition improves, your dosage needs may change. You will need to have blood tests regularly so that your health care provider can watch your condition. Contact a health care provider if:  Your symptoms do not get better with treatment.  You are taking thyroid  replacement medicine and: ? You sweat excessively. ? You have tremors. ? You feel anxious. ? You lose weight rapidly. ? You cannot tolerate heat. ? You have emotional swings. ? You have diarrhea. ? You feel weak. Get help right away if:  You develop chest pain.  You develop an irregular heartbeat.  You develop a rapid heartbeat. This information is not intended to replace advice given to you by your health care provider. Make sure you discuss any questions you have with your health care provider. Document Released: 03/23/2005 Document Revised: 08/29/2015 Document Reviewed: 08/08/2013 Elsevier Interactive Patient Education  2017 Elsevier Inc.  

## 2016-04-28 NOTE — Progress Notes (Signed)
   Subjective:    Patient ID: Shawn Lynch, male    DOB: 1990/12/22, 26 y.o.   MRN: [id number]  HPI Patient comes I today for follow up of hypothyroidism. He has not been seen in over 2 years and has had no blood work done. He has been out of meds for a week.    Review of Systems  Constitutional: Positive for fatigue.  HENT: Negative.   Respiratory: Negative.   Cardiovascular: Negative.   Gastrointestinal: Negative.   Genitourinary: Negative.   Neurological: Negative.   Psychiatric/Behavioral: Negative.   All other systems reviewed and are negative.      Objective:   Physical Exam  Constitutional: He is oriented to person, place, and time. He appears well-developed and well-nourished. No distress.  Cardiovascular: Normal rate and regular rhythm.   Pulmonary/Chest: Effort normal and breath sounds normal.  Neurological: He is alert and oriented to person, place, and time.  Skin: Skin is warm.  Psychiatric: He has a normal mood and affect. His behavior is normal. Judgment and thought content normal.   BP 109/64   Pulse 79   Temp 97.1 F (36.2 C) (Oral)   Ht _0  (1.778 m)   Wt 196 lb (88.9 kg)   BMI 28.12 kg/m         Assessment & Plan:   1. Acquired hypothyroidism    Orders Placed This Encounter  Procedures  . CMP14+EGFR  . Thyroid Panel With TSH   Meds ordered this encounter  Medications  . levothyroxine (SYNTHROID, LEVOTHROID) 150 MCG tablet    Sig: Take 1 tablet (150 mcg total) by mouth daily.    Dispense:  30 tablet    Refill:  2    Order Specific Question:   Supervising Provider    Answer:   Eustaquio Maize [[id number]]   Will need to follow  Up in 2 months to recheck TSH levels  Mary-Margaret Hassell Done, FNP

## 2016-04-29 LAB — CMP14+EGFR
A/G RATIO: 2.1 (ref 1.2–2.2)
ALBUMIN: 4.7 g/dL (ref 3.5–5.5)
ALT: 50 IU/L — ABNORMAL HIGH (ref 0–44)
AST: 89 IU/L — ABNORMAL HIGH (ref 0–40)
Alkaline Phosphatase: 131 IU/L — ABNORMAL HIGH (ref 39–117)
BILIRUBIN TOTAL: 0.2 mg/dL (ref 0.0–1.2)
BUN / CREAT RATIO: 13 (ref 9–20)
BUN: 22 mg/dL — ABNORMAL HIGH (ref 6–20)
CHLORIDE: 100 mmol/L (ref 96–106)
CO2: 29 mmol/L (ref 18–29)
Calcium: 9.5 mg/dL (ref 8.7–10.2)
Creatinine, Ser: 1.69 mg/dL — ABNORMAL HIGH (ref 0.76–1.27)
GFR, EST AFRICAN AMERICAN: 65 mL/min/{1.73_m2} (ref >59)
GFR, EST NON AFRICAN AMERICAN: 56 mL/min/{1.73_m2} — AB (ref >59)
Globulin, Total: 2.2 g/dL (ref 1.5–4.5)
Glucose: 51 mg/dL — ABNORMAL LOW (ref 65–99)
POTASSIUM: 4.6 mmol/L (ref 3.5–5.2)
Sodium: 142 mmol/L (ref 134–144)
TOTAL PROTEIN: 6.9 g/dL (ref 6.0–8.5)

## 2016-04-29 LAB — THYROID PANEL WITH TSH
Free Thyroxine Index: 0.3 — ABNORMAL LOW (ref 1.2–4.9)
T3 Uptake Ratio: 20 % — ABNORMAL LOW (ref 24–39)
T4, Total: 1.4 ug/dL — CL (ref 4.5–12.0)
TSH: 129.6 u[IU]/mL — ABNORMAL HIGH (ref 0.450–4.500)

## 2016-05-15 DIAGNOSIS — M546 Pain in thoracic spine: Secondary | ICD-10-CM | POA: Diagnosis not present

## 2016-05-15 DIAGNOSIS — S338XXA Sprain of other parts of lumbar spine and pelvis, initial encounter: Secondary | ICD-10-CM | POA: Diagnosis not present

## 2016-05-15 DIAGNOSIS — S134XXA Sprain of ligaments of cervical spine, initial encounter: Secondary | ICD-10-CM | POA: Diagnosis not present

## 2016-08-12 ENCOUNTER — Other Ambulatory Visit: Payer: Self-pay | Admitting: Nurse Practitioner

## 2016-08-13 NOTE — Telephone Encounter (Signed)
Refill denied- NTBS- no vist ince 2015

## 2016-08-13 NOTE — Telephone Encounter (Signed)
Left message- pt needs to be seen in order to get refills.

## 2016-08-20 ENCOUNTER — Encounter: Payer: Self-pay | Admitting: Nurse Practitioner

## 2016-08-20 ENCOUNTER — Ambulatory Visit (INDEPENDENT_AMBULATORY_CARE_PROVIDER_SITE_OTHER): Payer: 59 | Admitting: Nurse Practitioner

## 2016-08-20 VITALS — BP 111/67 | HR 50 | Temp 97.0°F | Ht 70.0 in | Wt 182.0 lb

## 2016-08-20 DIAGNOSIS — E039 Hypothyroidism, unspecified: Secondary | ICD-10-CM

## 2016-08-20 MED ORDER — LEVOTHYROXINE SODIUM 150 MCG PO TABS
150.0000 ug | ORAL_TABLET | Freq: Every day | ORAL | 5 refills | Status: DC
Start: 2016-08-20 — End: 2016-08-24

## 2016-08-20 NOTE — Progress Notes (Signed)
   Subjective:    Patient ID: Shawn Lynch, male    DOB: October 01, 1990, 26 y.o.   MRN: [id number]  HPI  Shawn Lynch is here today for follow up of chronic medical problem.  Outpatient Encounter Prescriptions as of 08/20/2016  Medication Sig  . levothyroxine (SYNTHROID, LEVOTHROID) 150 MCG tablet Take 1 tablet (150 mcg total) by mouth daily.   No facility-administered encounter medications on file as of 08/20/2016.     1. Acquired hypothyroidism - no isses that he is aware of- just needs labs refilled and lab work.    New complaints: None today    Review of Systems  Constitutional: Negative for diaphoresis.  Eyes: Negative for pain.  Respiratory: Negative for shortness of breath.   Cardiovascular: Negative for chest pain, palpitations and leg swelling.  Gastrointestinal: Negative for abdominal pain.  Endocrine: Negative for polydipsia.  Skin: Negative for rash.  Neurological: Negative for dizziness, weakness and headaches.  Hematological: Does not bruise/bleed easily.       Objective:   Physical Exam  Constitutional: He is oriented to person, place, and time. He appears well-developed and well-nourished.  HENT:  Head: Normocephalic.  Right Ear: External ear normal.  Left Ear: External ear normal.  Nose: Nose normal.  Mouth/Throat: Oropharynx is clear and moist.  Eyes: EOM are normal. Pupils are equal, round, and reactive to light.  Neck: Normal range of motion. Neck supple. No JVD present. No thyromegaly present.  Cardiovascular: Normal rate, regular rhythm, normal heart sounds and intact distal pulses.  Exam reveals no gallop and no friction rub.   No murmur heard. Pulmonary/Chest: Effort normal and breath sounds normal. No respiratory distress. He has no wheezes. He has no rales. He exhibits no tenderness.  Abdominal: Soft. Bowel sounds are normal. He exhibits no mass. There is no tenderness.  Musculoskeletal: Normal range of motion. He exhibits no edema.    Lymphadenopathy:    He has no cervical adenopathy.  Neurological: He is alert and oriented to person, place, and time. No cranial nerve deficit.  Skin: Skin is warm and dry.  Psychiatric: He has a normal mood and affect. His behavior is normal. Judgment and thought content normal.   BP 111/67   Pulse (!) 50   Temp 97 F (36.1 C) (Oral)   Ht '5\' 10"'$  (1.778 m)   Wt 182 lb (82.6 kg)   BMI 26.11 kg/m       Assessment & Plan:  1. Acquired hypothyroidism - CMP14+EGFR - Thyroid Panel With TSH - levothyroxine (SYNTHROID, LEVOTHROID) 150 MCG tablet; Take 1 tablet (150 mcg total) by mouth daily.  Dispense: 30 tablet; Refill: 5    Labs pending Health maintenance reviewed Diet and exercise encouraged Continue all meds Follow up  In 6 months   [place], FNP

## 2016-08-21 LAB — THYROID PANEL WITH TSH
Free Thyroxine Index: 1.5 (ref 1.2–4.9)
T3 UPTAKE RATIO: 25 % (ref 24–39)
T4, Total: 6 ug/dL (ref 4.5–12.0)
TSH: 31.07 u[IU]/mL — ABNORMAL HIGH (ref 0.450–4.500)

## 2016-08-21 LAB — CMP14+EGFR
A/G RATIO: 2.1 (ref 1.2–2.2)
ALBUMIN: 4.8 g/dL (ref 3.5–5.5)
ALT: 40 IU/L (ref 0–44)
AST: 51 IU/L — ABNORMAL HIGH (ref 0–40)
Alkaline Phosphatase: 103 IU/L (ref 39–117)
BUN / CREAT RATIO: 15 (ref 9–20)
BUN: 20 mg/dL (ref 6–20)
Bilirubin Total: 0.7 mg/dL (ref 0.0–1.2)
CALCIUM: 9.9 mg/dL (ref 8.7–10.2)
CO2: 27 mmol/L (ref 18–29)
Chloride: 98 mmol/L (ref 96–106)
Creatinine, Ser: 1.3 mg/dL — ABNORMAL HIGH (ref 0.76–1.27)
GFR, EST AFRICAN AMERICAN: 89 mL/min/{1.73_m2} (ref >59)
GFR, EST NON AFRICAN AMERICAN: 77 mL/min/{1.73_m2} (ref >59)
GLOBULIN, TOTAL: 2.3 g/dL (ref 1.5–4.5)
Glucose: 81 mg/dL (ref 65–99)
POTASSIUM: 5.2 mmol/L (ref 3.5–5.2)
Sodium: 140 mmol/L (ref 134–144)
TOTAL PROTEIN: 7.1 g/dL (ref 6.0–8.5)

## 2016-08-24 ENCOUNTER — Other Ambulatory Visit: Payer: Self-pay | Admitting: Nurse Practitioner

## 2016-08-24 MED ORDER — LEVOTHYROXINE SODIUM 200 MCG PO TABS: 200.0000 ug | ORAL_TABLET | Freq: Every day | ORAL | 11 refills | Status: DC

## 2017-06-04 DIAGNOSIS — E039 Hypothyroidism, unspecified: Secondary | ICD-10-CM | POA: Diagnosis not present

## 2017-06-15 DIAGNOSIS — Z0000 Encounter for general adult medical examination without abnormal findings: Secondary | ICD-10-CM | POA: Diagnosis not present

## 2017-06-22 DIAGNOSIS — Z0000 Encounter for general adult medical examination without abnormal findings: Secondary | ICD-10-CM | POA: Diagnosis not present

## 2017-06-24 DIAGNOSIS — E039 Hypothyroidism, unspecified: Secondary | ICD-10-CM | POA: Diagnosis not present

## 2017-06-24 DIAGNOSIS — Z23 Encounter for immunization: Secondary | ICD-10-CM | POA: Diagnosis not present

## 2017-11-03 DIAGNOSIS — W03XXXA Other fall on same level due to collision with another person, initial encounter: Secondary | ICD-10-CM | POA: Diagnosis not present

## 2017-11-03 DIAGNOSIS — Y9367 Activity, basketball: Secondary | ICD-10-CM | POA: Diagnosis not present

## 2017-11-03 DIAGNOSIS — S46811A Strain of other muscles, fascia and tendons at shoulder and upper arm level, right arm, initial encounter: Secondary | ICD-10-CM | POA: Diagnosis not present

## 2017-11-11 ENCOUNTER — Telehealth: Payer: Self-pay | Admitting: *Deleted

## 2017-11-11 NOTE — Telephone Encounter (Signed)
Left message to schedule annual exam with MMM

## 2018-02-28 ENCOUNTER — Ambulatory Visit: Payer: 59 | Admitting: Family Medicine

## 2018-05-11 ENCOUNTER — Ambulatory Visit: Payer: 59 | Admitting: Family Medicine

## 2018-05-11 VITALS — BP 126/79 | HR 71 | Temp 98.5°F | Ht 70.0 in | Wt 183.0 lb

## 2018-05-11 DIAGNOSIS — E039 Hypothyroidism, unspecified: Secondary | ICD-10-CM

## 2018-05-11 DIAGNOSIS — R7989 Other specified abnormal findings of blood chemistry: Secondary | ICD-10-CM | POA: Diagnosis not present

## 2018-05-11 MED ORDER — LEVOTHYROXINE SODIUM 175 MCG PO TABS: 175.0000 ug | ORAL_TABLET | Freq: Every day | ORAL | 0 refills | Status: DC

## 2018-05-11 NOTE — Patient Instructions (Signed)
You had labs performed today.  You will be contacted with the results of the labs once they are available, usually in the next 3 business days for routine lab work.   

## 2018-05-11 NOTE — Progress Notes (Signed)
Subjective: CC: Hypothyroidism PCP: Chevis Pretty, FNP SAY:TKZS Shawn Lynch is a 28 y.o. male presenting to clinic today for:  1.  Acquired hypothyroidism Patient notes that he recently had a decrease in his Synthroid dose to 175 mcg from 200 mcg daily in July.  That was the last time he had labs done.  He feels "the best he ever has" on this dose.  He does still have some difficulty with energy and weight gain but he attributes a lot of this to his intake.  He reports cold intolerance.  Denies any constipation, diarrhea, difficulty swallowing, change in voice, hair thinning.  He has been out of medication x1 week.  Last evaluation in [place].  2.  Elevated creatinine and liver function tests Noted on last evaluation greater than 1 year ago.  Unsure if this was resolved.  No symptoms including jaundice.  Not on any other medications except for Synthroid.   ROS: Per HPI  No Known Allergies Past Medical History:  Diagnosis Date  . Thyroid disease     Current Outpatient Medications:  .  levothyroxine (SYNTHROID) 175 MCG tablet, Take 1 tablet (175 mcg total) by mouth daily before breakfast., Disp: 90 tablet, Rfl: 0 Social History   Socioeconomic History  . Marital status: Single    Spouse name: Not on file  . Number of children: Not on file  . Years of education: Not on file  . Highest education level: Not on file  Occupational History  . Not on file  Social Needs  . Financial resource strain: Not on file  . Food insecurity:    Worry: Not on file    Inability: Not on file  . Transportation needs:    Medical: Not on file    Non-medical: Not on file  Tobacco Use  . Smoking status: Former Research scientist (life sciences)  . Smokeless tobacco: Never Used  Substance and Sexual Activity  . Alcohol use: Yes    Comment: OCC  . Drug use: No  . Sexual activity: Not on file  Lifestyle  . Physical activity:    Days per week: Not on file    Minutes per session: Not on file  . Stress: Not on file    Relationships  . Social connections:    Talks on phone: Not on file    Gets together: Not on file    Attends religious service: Not on file    Active member of club or organization: Not on file    Attends meetings of clubs or organizations: Not on file    Relationship status: Not on file  . Intimate partner violence:    Fear of current or ex partner: Not on file    Emotionally abused: Not on file    Physically abused: Not on file    Forced sexual activity: Not on file  Other Topics Concern  . Not on file  Social History Narrative  . Not on file   Family History  Problem Relation Age of Onset  . Hypothyroidism Mother   . Hypothyroidism Maternal Grandmother     Objective: Office vital signs reviewed. BP 126/79   Pulse 71   Temp 98.5 F (36.9 C) (Oral)   Ht '5\' 10"'  (1.778 m)   Wt 183 lb (83 kg)   BMI 26.26 kg/m   Physical Examination:  General: Awake, alert, well nourished, No acute distress HEENT: Normal, MMM    Neck: No masses palpated. No lymphadenopathy; no thyroid enlargement or palpable masses  Eyes: extraocular membranes intact, sclera white; no exophthalmos Cardio: regular rate and rhythm, S1S2 heard, no murmurs appreciated Pulm: clear to auscultation bilaterally, no wheezes, rhonchi or rales; normal work of breathing on room air Skin: No lesions.  Normal temperature Neuro: No tremor  Assessment/ Plan: 28 y.o. male   1. Acquired hypothyroidism Unsure of last TSH as patient was out of state for last check.  He does have fatigue and cold intolerance.  We will continue Synthroid 175 mcg.  Anticipate thyroid panel will be somewhat abnormal since he has been out of the medicine for 1 week.  Recheck liver function panel given elevation in LFTs and serum creatinine.  He will follow-up in 6 to 12 weeks pending thyroid lab results. - CMP14+EGFR - Thyroid Panel With TSH - levothyroxine (SYNTHROID) 175 MCG tablet; Take 1 tablet (175 mcg total) by mouth daily before  breakfast.  Dispense: 90 tablet; Refill: 0  2. Elevated serum creatinine Recheck as above - CMP14+EGFR   Orders Placed This Encounter  Procedures  . CMP14+EGFR  . Thyroid Panel With TSH   Meds ordered this encounter  Medications  . levothyroxine (SYNTHROID) 175 MCG tablet    Sig: Take 1 tablet (175 mcg total) by mouth daily before breakfast.    Dispense:  90 tablet    Refill:  0      Windell Moulding, DO [place] [phone]

## 2018-05-12 LAB — CMP14+EGFR
A/G RATIO: 2.1 (ref 1.2–2.2)
ALT: 29 IU/L (ref 0–44)
AST: 43 IU/L — ABNORMAL HIGH (ref 0–40)
Albumin: 4.4 g/dL (ref 4.1–5.2)
Alkaline Phosphatase: 114 IU/L (ref 39–117)
BILIRUBIN TOTAL: 0.3 mg/dL (ref 0.0–1.2)
BUN / CREAT RATIO: 10 (ref 9–20)
BUN: 12 mg/dL (ref 6–20)
CHLORIDE: 100 mmol/L (ref 96–106)
CO2: 27 mmol/L (ref 20–29)
Calcium: 9.4 mg/dL (ref 8.7–10.2)
Creatinine, Ser: 1.26 mg/dL (ref 0.76–1.27)
GFR, EST AFRICAN AMERICAN: 91 mL/min/{1.73_m2} (ref >59)
GFR, EST NON AFRICAN AMERICAN: 79 mL/min/{1.73_m2} (ref >59)
GLOBULIN, TOTAL: 2.1 g/dL (ref 1.5–4.5)
Glucose: 68 mg/dL (ref 65–99)
POTASSIUM: 4 mmol/L (ref 3.5–5.2)
SODIUM: 139 mmol/L (ref 134–144)
TOTAL PROTEIN: 6.5 g/dL (ref 6.0–8.5)

## 2018-05-12 LAB — THYROID PANEL WITH TSH
Free Thyroxine Index: 1.1 — ABNORMAL LOW (ref 1.2–4.9)
T3 UPTAKE RATIO: 24 % (ref 24–39)
T4, Total: 4.6 ug/dL (ref 4.5–12.0)
TSH: 37.6 u[IU]/mL — AB (ref 0.450–4.500)

## 2018-05-18 ENCOUNTER — Encounter: Payer: Self-pay | Admitting: *Deleted

## 2018-08-31 ENCOUNTER — Other Ambulatory Visit: Payer: Self-pay | Admitting: Family Medicine

## 2018-08-31 DIAGNOSIS — E039 Hypothyroidism, unspecified: Secondary | ICD-10-CM

## 2018-09-01 NOTE — Telephone Encounter (Signed)
[place]. NTBS 30 days sent to pharmacy

## 2018-09-01 NOTE — Telephone Encounter (Signed)
Please call to schedule an appointment for further refills.

## 2018-10-03 ENCOUNTER — Ambulatory Visit: Payer: 59 | Admitting: Nurse Practitioner

## 2018-10-03 ENCOUNTER — Encounter: Payer: Self-pay | Admitting: Nurse Practitioner

## 2018-10-03 ENCOUNTER — Other Ambulatory Visit: Payer: Self-pay

## 2018-10-03 VITALS — BP 119/67 | HR 65 | Temp 96.9°F | Ht 70.0 in | Wt 175.0 lb

## 2018-10-03 DIAGNOSIS — E039 Hypothyroidism, unspecified: Secondary | ICD-10-CM

## 2018-10-03 NOTE — Progress Notes (Signed)
   Subjective:    Patient ID: Shawn Lynch, male    DOB: 1990/10/21, 28 y.o.   MRN: [id number]   Chief Complaint: No chief complaint on file.    HPI:  1. Acquired hypothyroidism Patient says he is not having any problems that aware of. Last TSH was 37.600. he was on synthroid 191mcg daily and no change was made to medication. He say she is feeling fine. No /co fatigue, dry skin, or temperature intolerance.    Outpatient Encounter Medications as of 10/03/2018  Medication Sig  . levothyroxine (SYNTHROID) 175 MCG tablet Take 1 tablet (175 mcg total) by mouth daily before breakfast. (Needs to be seen before next refill)   No facility-administered encounter medications on file as of 10/03/2018.      Family History  Problem Relation Age of Onset  . Hypothyroidism Mother   . Hypothyroidism Maternal Grandmother     New complaints: None today    Review of Systems  Constitutional: Negative for activity change and appetite change.  HENT: Negative.   Eyes: Negative for pain.  Respiratory: Negative for shortness of breath.   Cardiovascular: Negative for chest pain, palpitations and leg swelling.  Gastrointestinal: Negative for abdominal pain.  Endocrine: Negative for polydipsia.  Genitourinary: Negative.   Skin: Negative for rash.  Neurological: Negative for dizziness, weakness and headaches.  Hematological: Does not bruise/bleed easily.  Psychiatric/Behavioral: Negative.   All other systems reviewed and are negative.      Objective:   Physical Exam Vitals signs and nursing note reviewed.  Constitutional:      Appearance: Normal appearance. He is well-developed.  HENT:     Head: Normocephalic.     Nose: Nose normal.  Eyes:     Pupils: Pupils are equal, round, and reactive to light.  Neck:     Musculoskeletal: Normal range of motion and neck supple.     Thyroid: No thyroid mass or thyromegaly.     Vascular: No carotid bruit or JVD.     Trachea: Phonation normal.   Cardiovascular:     Rate and Rhythm: Normal rate and regular rhythm.  Pulmonary:     Effort: Pulmonary effort is normal. No respiratory distress.     Breath sounds: Normal breath sounds.  Abdominal:     General: Bowel sounds are normal.     Palpations: Abdomen is soft.     Tenderness: There is no abdominal tenderness.  Musculoskeletal: Normal range of motion.  Lymphadenopathy:     Cervical: No cervical adenopathy.  Skin:    General: Skin is warm and dry.  Neurological:     Mental Status: He is alert and oriented to person, place, and time.  Psychiatric:        Behavior: Behavior normal.        Thought Content: Thought content normal.        Judgment: Judgment normal.   BP 119/67   Pulse 65   Temp (!) 96.9 F (36.1 C) (Oral)   Ht 5\' 10"  (1.778 m)   Wt 175 lb (79.4 kg)   BMI 25.11 kg/m          Assessment & Plan:  Shawn Lynch in today with chief complaint of Recheck thyroid   1. Acquired hypothyroidism Will wait on lab results before changing medication or refilling medication. - Thyroid Panel With TSH  Mary-Margaret Hassell Done, FNP

## 2018-10-03 NOTE — Patient Instructions (Signed)
Hypothyroidism  Hypothyroidism is when the thyroid gland does not make enough of certain hormones (it is underactive). The thyroid gland is a small gland located in the lower front part of the neck, just in front of the windpipe (trachea). This gland makes hormones that help control how the body uses food for energy (metabolism) as well as how the heart and brain function. These hormones also play a role in keeping your bones strong. When the thyroid is underactive, it produces too little of the hormones thyroxine (T4) and triiodothyronine (T3). What are the causes? This condition may be caused by:  Hashimoto's disease. This is a disease in which the body's disease-fighting system (immune system) attacks the thyroid gland. This is the most common cause.  Viral infections.  Pregnancy.  Certain medicines.  Birth defects.  Past radiation treatments to the head or neck for cancer.  Past treatment with radioactive iodine.  Past exposure to radiation in the environment.  Past surgical removal of part or all of the thyroid.  Problems with a gland in the center of the brain (pituitary gland).  Lack of enough iodine in the diet. What increases the risk? You are more likely to develop this condition if:  You are male.  You have a family history of thyroid conditions.  You use a medicine called lithium.  You take medicines that affect the immune system (immunosuppressants). What are the signs or symptoms? Symptoms of this condition include:  Feeling as though you have no energy (lethargy).  Not being able to tolerate cold.  Weight gain that is not explained by a change in diet or exercise habits.  Lack of appetite.  Dry skin.  Coarse hair.  Menstrual irregularity.  Slowing of thought processes.  Constipation.  Sadness or depression. How is this diagnosed? This condition may be diagnosed based on:  Your symptoms, your medical history, and a physical exam.  Blood  tests. You may also have imaging tests, such as an ultrasound or MRI. How is this treated? This condition is treated with medicine that replaces the thyroid hormones that your body does not make. After you begin treatment, it may take several weeks for symptoms to go away. Follow these instructions at home:  Take over-the-counter and prescription medicines only as told by your health care provider.  If you start taking any new medicines, tell your health care provider.  Keep all follow-up visits as told by your health care provider. This is important. ? As your condition improves, your dosage of thyroid hormone medicine may change. ? You will need to have blood tests regularly so that your health care provider can monitor your condition. Contact a health care provider if:  Your symptoms do not get better with treatment.  You are taking thyroid replacement medicine and you: ? Sweat a lot. ? Have tremors. ? Feel anxious. ? Lose weight rapidly. ? Cannot tolerate heat. ? Have emotional swings. ? Have diarrhea. ? Feel weak. Get help right away if you have:  Chest pain.  An irregular heartbeat.  A rapid heartbeat.  Difficulty breathing. Summary  Hypothyroidism is when the thyroid gland does not make enough of certain hormones (it is underactive).  When the thyroid is underactive, it produces too little of the hormones thyroxine (T4) and triiodothyronine (T3).  The most common cause is Hashimoto's disease, a disease in which the body's disease-fighting system (immune system) attacks the thyroid gland. The condition can also be caused by viral infections, medicine, pregnancy, or past   radiation treatment to the head or neck.  Symptoms may include weight gain, dry skin, constipation, feeling as though you do not have energy, and not being able to tolerate cold.  This condition is treated with medicine to replace the thyroid hormones that your body does not make. This information  is not intended to replace advice given to you by your health care provider. Make sure you discuss any questions you have with your health care provider. Document Released: 03/23/2005 Document Revised: 03/05/2017 Document Reviewed: 03/03/2017 Elsevier Patient Education  2020 [employer] Inc.  

## 2018-10-04 LAB — THYROID PANEL WITH TSH
Free Thyroxine Index: 1.9 (ref 1.2–4.9)
T3 Uptake Ratio: 30 % (ref 24–39)
T4, Total: 6.2 ug/dL (ref 4.5–12.0)
TSH: 1.74 u[IU]/mL (ref 0.450–4.500)

## 2018-10-06 MED ORDER — LEVOTHYROXINE SODIUM 175 MCG PO TABS: 175.0000 ug | ORAL_TABLET | Freq: Every day | ORAL | 1 refills | Status: DC

## 2018-10-06 NOTE — Addendum Note (Signed)
Addended by: Chevis Pretty on: 10/06/2018 09:38 AM   Modules accepted: Orders

## 2019-04-11 ENCOUNTER — Other Ambulatory Visit: Payer: Self-pay | Admitting: Nurse Practitioner

## 2019-04-11 DIAGNOSIS — E039 Hypothyroidism, unspecified: Secondary | ICD-10-CM

## 2019-11-21 ENCOUNTER — Other Ambulatory Visit: Payer: Self-pay | Admitting: Nurse Practitioner

## 2019-11-21 DIAGNOSIS — E039 Hypothyroidism, unspecified: Secondary | ICD-10-CM

## 2019-12-12 ENCOUNTER — Telehealth: Payer: Self-pay | Admitting: Nurse Practitioner

## 2019-12-12 NOTE — Telephone Encounter (Signed)
Pt is unsure who he would like to change his PCP to, but he would like to change from [facility].

## 2019-12-12 NOTE — Telephone Encounter (Signed)
Lmtcb.

## 2019-12-14 ENCOUNTER — Other Ambulatory Visit: Payer: Self-pay

## 2019-12-14 ENCOUNTER — Encounter: Payer: Self-pay | Admitting: Nurse Practitioner

## 2019-12-14 ENCOUNTER — Ambulatory Visit (INDEPENDENT_AMBULATORY_CARE_PROVIDER_SITE_OTHER): Payer: 59 | Admitting: Nurse Practitioner

## 2019-12-14 VITALS — BP 128/75 | HR 80 | Temp 98.1°F | Resp 20 | Ht 70.0 in | Wt 193.0 lb

## 2019-12-14 DIAGNOSIS — E039 Hypothyroidism, unspecified: Secondary | ICD-10-CM

## 2019-12-14 NOTE — Patient Instructions (Signed)
Hypothyroidism  Hypothyroidism is when the thyroid gland does not make enough of certain hormones (it is underactive). The thyroid gland is a small gland located in the lower front part of the neck, just in front of the windpipe (trachea). This gland makes hormones that help control how the body uses food for energy (metabolism) as well as how the heart and brain function. These hormones also play a role in keeping your bones strong. When the thyroid is underactive, it produces too little of the hormones thyroxine (T4) and triiodothyronine (T3). What are the causes? This condition may be caused by:  Hashimoto's disease. This is a disease in which the body's disease-fighting system (immune system) attacks the thyroid gland. This is the most common cause.  Viral infections.  Pregnancy.  Certain medicines.  Birth defects.  Past radiation treatments to the head or neck for cancer.  Past treatment with radioactive iodine.  Past exposure to radiation in the environment.  Past surgical removal of part or all of the thyroid.  Problems with a gland in the center of the brain (pituitary gland).  Lack of enough iodine in the diet. What increases the risk? You are more likely to develop this condition if:  You are male.  You have a family history of thyroid conditions.  You use a medicine called lithium.  You take medicines that affect the immune system (immunosuppressants). What are the signs or symptoms? Symptoms of this condition include:  Feeling as though you have no energy (lethargy).  Not being able to tolerate cold.  Weight gain that is not explained by a change in diet or exercise habits.  Lack of appetite.  Dry skin.  Coarse hair.  Menstrual irregularity.  Slowing of thought processes.  Constipation.  Sadness or depression. How is this diagnosed? This condition may be diagnosed based on:  Your symptoms, your medical history, and a physical exam.  Blood  tests. You may also have imaging tests, such as an ultrasound or MRI. How is this treated? This condition is treated with medicine that replaces the thyroid hormones that your body does not make. After you begin treatment, it may take several weeks for symptoms to go away. Follow these instructions at home:  Take over-the-counter and prescription medicines only as told by your health care provider.  If you start taking any new medicines, tell your health care provider.  Keep all follow-up visits as told by your health care provider. This is important. ? As your condition improves, your dosage of thyroid hormone medicine may change. ? You will need to have blood tests regularly so that your health care provider can monitor your condition. Contact a health care provider if:  Your symptoms do not get better with treatment.  You are taking thyroid replacement medicine and you: ? Sweat a lot. ? Have tremors. ? Feel anxious. ? Lose weight rapidly. ? Cannot tolerate heat. ? Have emotional swings. ? Have diarrhea. ? Feel weak. Get help right away if you have:  Chest pain.  An irregular heartbeat.  A rapid heartbeat.  Difficulty breathing. Summary  Hypothyroidism is when the thyroid gland does not make enough of certain hormones (it is underactive).  When the thyroid is underactive, it produces too little of the hormones thyroxine (T4) and triiodothyronine (T3).  The most common cause is Hashimoto's disease, a disease in which the body's disease-fighting system (immune system) attacks the thyroid gland. The condition can also be caused by viral infections, medicine, pregnancy, or past   radiation treatment to the head or neck.  Symptoms may include weight gain, dry skin, constipation, feeling as though you do not have energy, and not being able to tolerate cold.  This condition is treated with medicine to replace the thyroid hormones that your body does not make. This information  is not intended to replace advice given to you by your health care provider. Make sure you discuss any questions you have with your health care provider. Document Revised: 03/05/2017 Document Reviewed: 03/03/2017 Elsevier Patient Education  2020 Else[employer] Inc[employer]  

## 2019-12-14 NOTE — Progress Notes (Signed)
Subjective:    Patient ID: Shawn Lynch, male    DOB: December 06, 1990, 29 y.o.   MRN: [id number]   Chief Complaint: Medication refill  HPI:  1. Acquired hypothyroidism Lab Results  Component Value Date   TSH 1.740 10/03/2018   Takes medication as prescribed. States he has no complaints related to condition. Would like yearly supply for medication. Currently uninsured and is waiting for next enrollment period.     Outpatient Encounter Medications as of 12/14/2019  Medication Sig  . levothyroxine (SYNTHROID) 175 MCG tablet TAKE 1 TABLET BY MOUTH DAILY BEFORE BREAKFAST   No facility-administered encounter medications on file as of 12/14/2019.    History reviewed. No pertinent surgical history.  Family History  Problem Relation Age of Onset  . Hypothyroidism Mother   . Hypothyroidism Maternal Grandmother     New complaints: No new complaints today.  Social history: Lives with grandmother and mother, trains others in weight lifting. Hikes.   Controlled substance contract: n/a    Review of Systems  Constitutional: Negative.   HENT: Negative.   Eyes: Negative.   Respiratory: Negative.   Cardiovascular: Negative.   Gastrointestinal: Negative.   Endocrine: Negative.   Genitourinary: Negative.   Musculoskeletal: Negative.   Allergic/Immunologic: Negative.   Neurological: Negative.   Hematological: Negative.   Psychiatric/Behavioral: Negative.   All other systems reviewed and are negative.      Objective:   Physical Exam Vitals and nursing note reviewed.  Constitutional:      Appearance: Normal appearance.  HENT:     Head: Normocephalic and atraumatic.     Right Ear: Tympanic membrane, ear canal and external ear normal.     Left Ear: Tympanic membrane, ear canal and external ear normal.     Nose: Nose normal.     Mouth/Throat:     Mouth: Mucous membranes are moist.     Pharynx: Oropharynx is clear.  Eyes:     Extraocular Movements: Extraocular movements  intact.     Conjunctiva/sclera: Conjunctivae normal.     Pupils: Pupils are equal, round, and reactive to light.  Cardiovascular:     Rate and Rhythm: Normal rate and regular rhythm.     Pulses: Normal pulses.     Heart sounds: Normal heart sounds.  Pulmonary:     Effort: Pulmonary effort is normal.     Breath sounds: Normal breath sounds.  Abdominal:     General: Abdomen is flat. Bowel sounds are normal.     Palpations: Abdomen is soft.  Musculoskeletal:        General: Normal range of motion.     Cervical back: Normal range of motion.  Skin:    General: Skin is warm and dry.     Capillary Refill: Capillary refill takes less than 2 seconds.  Neurological:     General: No focal deficit present.     Mental Status: He is alert and oriented to person, place, and time. Mental status is at baseline.  Psychiatric:        Mood and Affect: Mood normal.        Behavior: Behavior normal.        Thought Content: Thought content normal.        Judgment: Judgment normal.    BP 128/75   Pulse 80   Temp 98.1 F (36.7 C) (Temporal)   Resp 20   Ht 5\' 10"  (1.778 m)   Wt 193 lb (87.5 kg)   SpO2 98%  BMI 27.69 kg/m       Assessment & Plan:  DIONICIO SHELNUTT comes in today with chief complaint of No chief complaint on file.   Diagnosis and orders addressed:  1. Acquired hypothyroidism Exercise daily, looks for ways to mindful and relax, eat a well-balanced diet, and aim for at least 8 hours of sleep each night.    Labs pending Health Maintenance reviewed Diet and exercise encouraged  Follow up plan: Follow-up in 1 year.   Mary-Margaret Daphine Deutscher, FNP

## 2019-12-15 ENCOUNTER — Other Ambulatory Visit: Payer: Self-pay | Admitting: *Deleted

## 2019-12-15 DIAGNOSIS — E039 Hypothyroidism, unspecified: Secondary | ICD-10-CM

## 2019-12-15 LAB — THYROID PANEL WITH TSH
Free Thyroxine Index: 1.2 (ref 1.2–4.9)
T3 Uptake Ratio: 25 % (ref 24–39)
T4, Total: 4.7 ug/dL (ref 4.5–12.0)
TSH: 12.8 u[IU]/mL — ABNORMAL HIGH (ref 0.450–4.500)

## 2019-12-15 MED ORDER — LEVOTHYROXINE SODIUM 200 MCG PO TABS: 200.0000 ug | ORAL_TABLET | Freq: Every day | ORAL | 1 refills | Status: DC

## 2019-12-15 MED ORDER — LEVOTHYROXINE SODIUM 175 MCG PO TABS: ORAL_TABLET | ORAL | 0 refills | Status: DC

## 2019-12-15 NOTE — Addendum Note (Signed)
Addended by: Bennie Pierini on: 12/15/2019 10:08 AM   Modules accepted: Orders

## 2020-02-06 ENCOUNTER — Other Ambulatory Visit: Payer: Self-pay | Admitting: Nurse Practitioner

## 2020-02-06 NOTE — Telephone Encounter (Signed)
Per below pharmacy note with refill request for the 200 mcg dose this was last RFd on 01/17/20. Refill this for 30 days with note, Needs repeat labwork, Discontinued the 175 mcg dose. That was sent per the labwork conversation on 12/14/19 To pharmacy: This prescription was filled on 01/17/2020. Any refills authorized will be placed on file.

## 2020-02-21 ENCOUNTER — Ambulatory Visit (INDEPENDENT_AMBULATORY_CARE_PROVIDER_SITE_OTHER): Payer: Self-pay | Admitting: Family

## 2020-02-21 ENCOUNTER — Encounter: Payer: Self-pay | Admitting: Family

## 2020-02-21 ENCOUNTER — Other Ambulatory Visit: Payer: Self-pay

## 2020-02-21 VITALS — BP 132/67 | HR 78 | Temp 98.3°F | Ht 70.0 in | Wt 195.8 lb

## 2020-02-21 DIAGNOSIS — E039 Hypothyroidism, unspecified: Secondary | ICD-10-CM

## 2020-02-21 DIAGNOSIS — B3789 Other sites of candidiasis: Secondary | ICD-10-CM

## 2020-02-21 MED ORDER — KETOCONAZOLE 2 % EX CREA: 1.0000 "application " | TOPICAL_CREAM | Freq: Every day | CUTANEOUS | 0 refills | Status: DC

## 2020-02-21 MED ORDER — LEVOTHYROXINE SODIUM 200 MCG PO TABS: 200.0000 ug | ORAL_TABLET | Freq: Every day | ORAL | 3 refills | Status: DC

## 2020-02-21 MED ORDER — NYSTATIN 100000 UNIT/GM EX POWD: 1.0000 "application " | Freq: Three times a day (TID) | CUTANEOUS | 0 refills | Status: DC

## 2020-02-21 NOTE — Patient Instructions (Signed)

## 2020-02-21 NOTE — Progress Notes (Signed)
Subjective:    Patient ID: Shawn Lynch, male    DOB: 02/04/1991, 29 y.o.   MRN: [id number]  Chief Complaint  Patient presents with  . Rash    increase of groin x 3 weeks with itching.No change in lostions soaps detergents   . SEXUALLY TRANSMITTED DISEASE    DISCUSS GETTING CHECKED   . Hypothyroidism    Rash This is a new problem. The current episode started 1 to 4 weeks ago. The problem has been gradually worsening since onset. The affected locations include the groin. The rash is characterized by itchiness, dryness and redness. He was exposed to nothing. Pertinent negatives include no congestion, cough, diarrhea, fatigue, rhinorrhea, shortness of breath or sore throat. Treatments tried: antifungal  The treatment provided no relief.  Thyroid Problem Presents for follow-up visit. Patient reports no anxiety, diarrhea, dry skin, fatigue or hair loss. The symptoms have been stable.      Review of Systems  Constitutional: Negative for fatigue.  HENT: Negative for congestion, rhinorrhea and sore throat.   Respiratory: Negative for cough and shortness of breath.   Gastrointestinal: Negative for diarrhea.  Skin: Positive for rash.  Psychiatric/Behavioral: The patient is not nervous/anxious.   All other systems reviewed and are negative.      Objective:   Physical Exam Vitals reviewed.  Constitutional:      General: He is not in acute distress.    Appearance: He is well-developed.  HENT:     Head: Normocephalic.     Right Ear: Tympanic membrane normal.     Left Ear: Tympanic membrane normal.  Eyes:     General:        Right eye: No discharge.        Left eye: No discharge.     Pupils: Pupils are equal, round, and reactive to light.  Neck:     Thyroid: No thyromegaly.  Cardiovascular:     Rate and Rhythm: Normal rate and regular rhythm.     Heart sounds: Normal heart sounds. No murmur heard.   Pulmonary:     Effort: Pulmonary effort is normal. No respiratory  distress.     Breath sounds: Normal breath sounds. No wheezing.  Abdominal:     General: Bowel sounds are normal. There is no distension.     Palpations: Abdomen is soft.     Tenderness: There is no abdominal tenderness.  Genitourinary:      Comments: Erythemas patchy rash in bilateral groin Musculoskeletal:        General: No tenderness. Normal range of motion.     Cervical back: Normal range of motion and neck supple.  Skin:    General: Skin is warm and dry.     Findings: No erythema or rash.  Neurological:     Mental Status: He is alert and oriented to person, place, and time.     Cranial Nerves: No cranial nerve deficit.     Deep Tendon Reflexes: Reflexes are normal and symmetric.  Psychiatric:        Behavior: Behavior normal.        Thought Content: Thought content normal.        Judgment: Judgment normal.     BP 132/67   Pulse 78   Temp 98.3 F (36.8 C) (Temporal)   Ht 5\' 10"  (1.778 m)   Wt 195 lb 12.8 oz (88.8 kg)   BMI 28.09 kg/m      Assessment & Plan:  Shawn Lynch comes  in today with chief complaint of Rash (increase of groin x 3 weeks with itching.No change in lostions soaps detergents ), SEXUALLY TRANSMITTED DISEASE (DISCUSS GETTING CHECKED ), and Hypothyroidism   Diagnosis and orders addressed:  1. Candida rash of groin Keep clean and dry Do not scratch Use Nystatin powder several times a day - ketoconazole (NIZORAL) 2 % cream; Apply 1 application topically daily.  Dispense: 15 g; Refill: 0 - nystatin (MYCOSTATIN/NYSTOP) powder; Apply 1 application topically 3 (three) times daily.  Dispense: 15 g; Refill: 0  2. Hypothyroidism, unspecified type - levothyroxine (SYNTHROID) 200 MCG tablet; Take 1 tablet (200 mcg total) by mouth daily before breakfast. (Needs repeat labwork)  Dispense: 90 tablet; Refill: 3 - TSH   PT will call [facility] to discuss STD testing since he is self pay.   Jannifer Rodney, FNP

## 2020-02-22 LAB — TSH: TSH: 1.85 u[IU]/mL (ref 0.450–4.500)

## 2020-02-27 ENCOUNTER — Ambulatory Visit: Payer: Self-pay | Admitting: Nurse Practitioner

## 2020-03-18 ENCOUNTER — Other Ambulatory Visit: Payer: Self-pay | Admitting: Family

## 2020-03-18 DIAGNOSIS — B3789 Other sites of candidiasis: Secondary | ICD-10-CM

## 2020-04-17 ENCOUNTER — Ambulatory Visit (INDEPENDENT_AMBULATORY_CARE_PROVIDER_SITE_OTHER): Payer: Self-pay | Admitting: Nurse Practitioner

## 2020-04-17 ENCOUNTER — Other Ambulatory Visit: Payer: Self-pay

## 2020-04-17 ENCOUNTER — Encounter: Payer: Self-pay | Admitting: Nurse Practitioner

## 2020-04-17 DIAGNOSIS — B3789 Other sites of candidiasis: Secondary | ICD-10-CM

## 2020-04-17 MED ORDER — CEPHALEXIN 500 MG PO CAPS: 500.0000 mg | ORAL_CAPSULE | Freq: Four times a day (QID) | ORAL | 0 refills | Status: DC

## 2020-04-17 MED ORDER — KETOCONAZOLE 2 % EX CREA: TOPICAL_CREAM | CUTANEOUS | 0 refills | Status: DC

## 2020-04-17 MED ORDER — NYSTATIN 100000 UNIT/GM EX POWD: CUTANEOUS | 0 refills | Status: DC

## 2020-04-17 NOTE — Progress Notes (Signed)
Acute Office Visit  Subjective:    Patient ID: Shawn Lynch, male    DOB: [birth year], 30 y.o.   MRN: [id number]  Chief Complaint  Patient presents with  . Rash    groin    Rash This is a recurrent problem. The current episode started more than 1 month ago. The problem is unchanged. Location: Groin, buttocks. Pertinent negatives include no congestion, cough, fatigue or joint pain. Past treatments include anti-itch cream. The treatment provided no relief.     Past Medical History:  Diagnosis Date  . Thyroid disease     History reviewed. No pertinent surgical history.  Family History  Problem Relation Age of Onset  . Hypothyroidism Mother   . Hypothyroidism Maternal Grandmother     Social History   Socioeconomic History  . Marital status: Single    Spouse name: Not on file  . Number of children: Not on file  . Years of education: Not on file  . Highest education level: Not on file  Occupational History  . Not on file  Tobacco Use  . Smoking status: Former Games developer  . Smokeless tobacco: Never Used  Substance and Sexual Activity  . Alcohol use: Yes    Comment: OCC  . Drug use: No  . Sexual activity: Not on file  Other Topics Concern  . Not on file  Social History Narrative  . Not on file   Social Determinants of Health   Financial Resource Strain: Not on file  Food Insecurity: Not on file  Transportation Needs: Not on file  Physical Activity: Not on file  Stress: Not on file  Social Connections: Not on file  Intimate Partner Violence: Not on file    Outpatient Medications Prior to Visit  Medication Sig Dispense Refill  . levothyroxine (SYNTHROID) 200 MCG tablet Take 1 tablet (200 mcg total) by mouth daily before breakfast. (Needs repeat labwork) 90 tablet 3  . ketoconazole (NIZORAL) 2 % cream apply ONE application topically DAILY 15 g 0  . nystatin (MYCOSTATIN/NYSTOP) powder apply ONE application topically THREE TIMES DAILY 15 g 0   No  facility-administered medications prior to visit.    No Known Allergies  Review of Systems  Constitutional: Negative.  Negative for fatigue.  HENT: Negative for congestion.   Eyes: Negative.   Respiratory: Negative.  Negative for cough.   Cardiovascular: Negative.   Genitourinary: Negative.   Musculoskeletal: Negative.  Negative for joint pain.  Skin: Positive for color change and rash.  Psychiatric/Behavioral: Negative.   All other systems reviewed and are negative.      Objective:    Physical Exam Vitals reviewed.  Constitutional:      Appearance: Normal appearance.  HENT:     Head: Normocephalic.     Nose: Nose normal.  Eyes:     Conjunctiva/sclera: Conjunctivae normal.  Cardiovascular:     Rate and Rhythm: Normal rate and regular rhythm.     Pulses: Normal pulses.     Heart sounds: Normal heart sounds.  Pulmonary:     Effort: Pulmonary effort is normal.     Breath sounds: Normal breath sounds.  Abdominal:     General: Bowel sounds are normal.  Skin:    General: Skin is dry.     Findings: Erythema and rash present.          Comments: Rash worsening due to moisture. Red and raised   Neurological:     Mental Status: He is alert.  BP 126/73   Pulse 90   Temp 97.8 F (36.6 C)   Ht 5\' 10"  (1.778 m)   Wt 190 lb 9.6 oz (86.5 kg)   SpO2 99%   BMI 27.35 kg/m  Wt Readings from Last 3 Encounters:  04/17/20 190 lb 9.6 oz (86.5 kg)  02/21/20 195 lb 12.8 oz (88.8 kg)  12/14/19 193 lb (87.5 kg)    There are no preventive care reminders to display for this patient.  There are no preventive care reminders to display for this patient.   Lab Results  Component Value Date   TSH 1.850 02/21/2020   Lab Results  Component Value Date   WBC 6.8 11/21/2012   HGB 14.8 11/21/2012   HCT 43.6 11/21/2012   MCV 82.0 11/21/2012   Lab Results  Component Value Date   NA 139 05/11/2018   K 4.0 05/11/2018   CO2 27 05/11/2018   GLUCOSE 68 05/11/2018   BUN 12  05/11/2018   CREATININE 1.26 05/11/2018   BILITOT 0.3 05/11/2018   ALKPHOS 114 05/11/2018   AST 43 (H) 05/11/2018   ALT 29 05/11/2018   PROT 6.5 05/11/2018   ALBUMIN 4.4 05/11/2018   CALCIUM 9.4 05/11/2018       Assessment & Plan:   Problem List Items Addressed This Visit      Other   Candida rash of groin    Candidal rash of groin not Well Controlled.  Started patient on Keflex 10 days, nystatin powder, Nizoral 2% cream.  Advised patient to keep skin dry and clean after washing with Dial soap.  Follow-up in 2 weeks with worsening unresolved symptoms.  Rx sent to pharmacy       Relevant Medications   ketoconazole (NIZORAL) 2 % cream   cephALEXin (KEFLEX) 500 MG capsule   nystatin (MYCOSTATIN/NYSTOP) powder       Meds ordered this encounter  Medications  . ketoconazole (NIZORAL) 2 % cream    Sig: apply ONE application topically DAILY    Dispense:  15 g    Refill:  0    Order Specific Question:   Supervising Provider    Answer:   07/10/2018 Raliegh Ip  . cephALEXin (KEFLEX) 500 MG capsule    Sig: Take 1 capsule (500 mg total) by mouth 4 (four) times daily.    Dispense:  40 capsule    Refill:  0    Order Specific Question:   Supervising Provider    Answer:   [[id number]] Raliegh Ip  . nystatin (MYCOSTATIN/NYSTOP) powder    Sig: apply ONE application topically THREE TIMES DAILY    Dispense:  15 g    Refill:  0    Order Specific Question:   Supervising Provider    Answer:   [[id number]] Raliegh Ip     [[id number]], NP

## 2020-04-17 NOTE — Assessment & Plan Note (Signed)
Candidal rash of groin not Well Controlled.  Started patient on Keflex 10 days, nystatin powder, Nizoral 2% cream.  Advised patient to keep skin dry and clean after washing with Dial soap.  Follow-up in 2 weeks with worsening unresolved symptoms.  Rx sent to pharmacy

## 2020-04-17 NOTE — Patient Instructions (Addendum)
Started Keflex for 2 weeks, clean area with warm soapy water pat dry.  Nystatin powder.  Follow-up in 2 weeks.  Rash, Adult  A rash is a change in the color of your skin. A rash can also change the way your skin feels. There are many different conditions and factors that can cause a rash. Follow these instructions at home: The goal of treatment is to stop the itching and keep the rash from spreading. Watch for any changes in your symptoms. Let your doctor know about them. Follow these instructions to help with your condition: Medicine Take or apply over-the-counter and prescription medicines only as told by your doctor. These may include medicines:  To treat red or swollen skin (corticosteroid creams).  To treat itching.  To treat an allergy (oral antihistamines).  To treat very bad symptoms (oral corticosteroids).   Skin care  Put cool cloths (compresses) on the affected areas.  Do not scratch or rub your skin.  Avoid covering the rash. Make sure that the rash is exposed to air as much as possible. Managing itching and discomfort  Avoid hot showers or baths. These can make itching worse. A cold shower may help.  Try taking a bath with: ? Epsom salts. You can get these at your local pharmacy or grocery store. Follow the instructions on the package. ? Baking soda. Pour a small amount into the bath as told by your doctor. ? Colloidal oatmeal. You can get this at your local pharmacy or grocery store. Follow the instructions on the package.  Try putting baking soda paste onto your skin. Stir water into baking soda until it gets like a paste.  Try putting on a lotion that relieves itchiness (calamine lotion).  Keep cool and out of the sun. Sweating and being hot can make itching worse. General instructions  Rest as needed.  Drink enough fluid to keep your pee (urine) pale yellow.  Wear loose-fitting clothing.  Avoid scented soaps, detergents, and perfumes. Use gentle soaps,  detergents, perfumes, and other cosmetic products.  Avoid anything that causes your rash. Keep a journal to help track what causes your rash. Write down: ? What you eat. ? What cosmetic products you use. ? What you drink. ? What you wear. This includes jewelry.  Keep all follow-up visits as told by your doctor. This is important.   Contact a doctor if:  You sweat at night.  You lose weight.  You pee (urinate) more than normal.  You pee less than normal, or you notice that your pee is a darker color than normal.  You feel weak.  You throw up (vomit).  Your skin or the whites of your eyes look yellow (jaundice).  Your skin: ? Tingles. ? Is numb.  Your rash: ? Does not go away after a few days. ? Gets worse.  You are: ? More thirsty than normal. ? More tired than normal.  You have: ? New symptoms. ? Pain in your belly (abdomen). ? A fever. ? Watery poop (diarrhea). Get help right away if:  You have a fever and your symptoms suddenly get worse.  You start to feel mixed up (confused).  You have a very bad headache or a stiff neck.  You have very bad joint pains or stiffness.  You have jerky movements that you cannot control (seizure).  Your rash covers all or most of your body. The rash may or may not be painful.  You have blisters that: ? Are on top  of the rash. ? Grow larger. ? Grow together. ? Are painful. ? Are inside your nose or mouth.  You have a rash that: ? Looks like purple pinprick-sized spots all over your body. ? Has a "bull's eye" or looks like a target. ? Is red and painful, causes your skin to peel, and is not from being in the sun too long. Summary  A rash is a change in the color of your skin. A rash can also change the way your skin feels.  The goal of treatment is to stop the itching and keep the rash from spreading.  Take or apply over-the-counter and prescription medicines only as told by your doctor.  Contact a doctor if you  have new symptoms or symptoms that get worse.  Keep all follow-up visits as told by your doctor. This is important. This information is not intended to replace advice given to you by your health care provider. Make sure you discuss any questions you have with your health care provider. Document Revised: 07/15/2018 Document Reviewed: 10/25/2017 Elsevier Patient Education  2021 [employer].

## 2020-05-24 ENCOUNTER — Telehealth: Payer: Self-pay

## 2020-05-24 DIAGNOSIS — R21 Rash and other nonspecific skin eruption: Secondary | ICD-10-CM

## 2020-05-24 NOTE — Telephone Encounter (Signed)
REFERRAL REQUEST Telephone Note  Have you been seen at our office for this problem? Yes  (Advise that they may need an appointment with their PCP before a referral can be done)  Reason for Referral: rash Referral discussed with patient: yes Best contact number of patient for referral team: [phone]   Has patient been seen by a specialist for this issue before: no Patient provider preference for referral: na Patient location preference for referral: na   Patient notified that referrals can take up to a week or longer to process. If they haven't heard anything within a week they should call back and speak with the referral department.

## 2020-05-24 NOTE — Telephone Encounter (Signed)
Referral placed.

## 2020-05-27 ENCOUNTER — Telehealth: Payer: Self-pay | Admitting: Nurse Practitioner

## 2020-05-27 NOTE — Telephone Encounter (Signed)
Sorry to hear about your encounter, I spoke to patient. Please Send him to a dermatology in [place]. Thank you courtney

## 2020-05-29 NOTE — Telephone Encounter (Signed)
Thank you lady

## 2020-05-31 ENCOUNTER — Other Ambulatory Visit: Payer: Self-pay | Admitting: Family

## 2020-11-29 ENCOUNTER — Ambulatory Visit (INDEPENDENT_AMBULATORY_CARE_PROVIDER_SITE_OTHER): Payer: Self-pay | Admitting: Family

## 2020-11-29 ENCOUNTER — Encounter: Payer: Self-pay | Admitting: Family

## 2020-11-29 ENCOUNTER — Other Ambulatory Visit: Payer: Self-pay

## 2020-11-29 VITALS — BP 128/73 | HR 85 | Temp 98.0°F | Ht 70.0 in | Wt 195.4 lb

## 2020-11-29 DIAGNOSIS — R748 Abnormal levels of other serum enzymes: Secondary | ICD-10-CM

## 2020-11-29 DIAGNOSIS — E039 Hypothyroidism, unspecified: Secondary | ICD-10-CM

## 2020-11-29 MED ORDER — LEVOTHYROXINE SODIUM 200 MCG PO TABS: 200.0000 ug | ORAL_TABLET | Freq: Every day | ORAL | 4 refills | Status: DC

## 2020-11-29 NOTE — Progress Notes (Signed)
   Subjective:    Patient ID: Shawn Lynch, male    DOB: Oct 31, 1990, 30 y.o.   MRN: [id number]  Chief Complaint  Patient presents with   Hypothyroidism   Pt presents to the office today for follow up thyroid. Shawn Lynch has had liver enzymes elevated in the past. Denies alcohol or tylenol intake.  Thyroid Problem Presents for follow-up visit. Patient reports no anxiety, constipation, dry skin, fatigue, leg swelling or visual change. The symptoms have been stable.     Review of Systems  Constitutional:  Negative for fatigue.  Gastrointestinal:  Negative for constipation.  Psychiatric/Behavioral:  The patient is not nervous/anxious.   All other systems reviewed and are negative.     Objective:   Physical Exam Vitals reviewed.  Constitutional:      General: Shawn Lynch is not in acute distress.    Appearance: Shawn Lynch is well-developed.  HENT:     Head: Normocephalic.     Right Ear: Tympanic membrane normal.     Left Ear: Tympanic membrane normal.  Eyes:     General:        Right eye: No discharge.        Left eye: No discharge.     Pupils: Pupils are equal, round, and reactive to light.  Neck:     Thyroid: No thyromegaly.  Cardiovascular:     Rate and Rhythm: Normal rate and regular rhythm.     Heart sounds: Normal heart sounds. No murmur heard. Pulmonary:     Effort: Pulmonary effort is normal. No respiratory distress.     Breath sounds: Normal breath sounds. No wheezing.  Abdominal:     General: Bowel sounds are normal. There is no distension.     Palpations: Abdomen is soft.     Tenderness: There is no abdominal tenderness.  Musculoskeletal:        General: No tenderness. Normal range of motion.     Cervical back: Normal range of motion and neck supple.  Skin:    General: Skin is warm and dry.     Findings: No erythema or rash.  Neurological:     Mental Status: Shawn Lynch is alert and oriented to person, place, and time.     Cranial Nerves: No cranial nerve deficit.     Deep Tendon  Reflexes: Reflexes are normal and symmetric.  Psychiatric:        Behavior: Behavior normal.        Thought Content: Thought content normal.        Judgment: Judgment normal.      BP 128/73   Pulse 85   Temp 98 F (36.7 C) (Temporal)   Ht _0  (1.778 m)   Wt 195 lb 6.4 oz (88.6 kg)   BMI 28.04 kg/m      Assessment & Plan:  Shawn Lynch comes in today with chief complaint of Hypothyroidism   Diagnosis and orders addressed:  1. Acquired hypothyroidism Labs pending  - CMP14+EGFR  2. Hypothyroidism, unspecified type - levothyroxine (SYNTHROID) 200 MCG tablet; Take 1 tablet (200 mcg total) by mouth daily before breakfast. (Needs repeat labwork)  Dispense: 90 tablet; Refill: 4 - CMP14+EGFR - TSH  3. Elevated liver enzymes Avoid alcohol, tylenol. Continue low fat diet and exercise Labs pending - CMP14+EGFR   Labs pending Health Maintenance reviewed- Pt will price check TDAP at health department Diet and exercise encouraged  Follow up plan: 1 year    Evelina Dun, FNP

## 2020-11-29 NOTE — Patient Instructions (Signed)
Fatty Liver Disease  The liver converts food into energy, removes toxic material from the blood, makes important proteins, and absorbs necessary vitamins from food. Fatty liver disease occurs when too much fat has built up in your liver cells. Fatty liverdisease is also called hepatic steatosis. In many cases, fatty liver disease does not cause symptoms or problems. It is often diagnosed when tests are being done for other reasons. However, over time, fatty liver can cause inflammation that may lead to more serious liver problems, such as scarring of the liver (cirrhosis) and liver failure. Fatty liver is associated with insulin resistance, increased body fat, high blood pressure (hypertension), and high cholesterol. These are features of metabolic syndrome and increaseyour risk for stroke, diabetes, and heart disease. What are the causes? This condition may be caused by components of metabolic syndrome: Obesity. Insulin resistance. High cholesterol. Other causes: Alcohol abuse. Poor nutrition. Cushing syndrome. Pregnancy. Certain drugs. Poisons. Some viral infections. What increases the risk? You are more likely to develop this condition if you: Abuse alcohol. Are overweight. Have diabetes. Have hepatitis. Have a high triglyceride level. Are pregnant. What are the signs or symptoms? Fatty liver disease often does not cause symptoms. If symptoms do develop, they can include: Fatigue and weakness. Weight loss. Confusion. Nausea, vomiting, or abdominal pain. Yellowing of your skin and the white parts of your eyes (jaundice). Itchy skin. How is this diagnosed? This condition may be diagnosed by: A physical exam and your medical history. Blood tests. Imaging tests, such as an ultrasound, CT scan, or MRI. A liver biopsy. A small sample of liver tissue is removed using a needle. The sample is then looked at under a microscope. How is this treated? Fatty liver disease is often  caused by other health conditions. Treatment for fatty liver may involve medicines and lifestyle changes to manage conditions such as: Alcoholism. High cholesterol. Diabetes. Being overweight or obese. Follow these instructions at home:  Do not drink alcohol. If you have trouble quitting, ask your health care provider how to safely quit with the help of medicine or a supervised program. This is important to keep your condition from getting worse. Eat a healthy diet as told by your health care provider. Ask your health care provider about working with a dietitian to develop an eating plan. Exercise regularly. This can help you lose weight and control your cholesterol and diabetes. Talk to your health care provider about an exercise plan and which activities are best for you. Take over-the-counter and prescription medicines only as told by your health care provider. Keep all follow-up visits. This is important. Contact a health care provider if: You have trouble controlling your: Blood sugar. This is especially important if you have diabetes. Cholesterol. Drinking of alcohol. Get help right away if: You have abdominal pain. You have jaundice. You have nausea and are vomiting. You vomit blood or material that looks like coffee grounds. You have stools that are black, tar-like, or bloody. Summary Fatty liver disease develops when too much fat builds up in the cells of your liver. Fatty liver disease often causes no symptoms or problems. However, over time, fatty liver can cause inflammation that may lead to more serious liver problems, such as scarring of the liver (cirrhosis). You are more likely to develop this condition if you abuse alcohol, are pregnant, are overweight, have diabetes, have hepatitis, or have high triglyceride or cholesterol levels. Contact your health care provider if you have trouble controlling your blood sugar, cholesterol,   or drinking of alcohol. This information is  not intended to replace advice given to you by your health care provider. Make sure you discuss any questions you have with your healthcare provider. Document Revised: 01/04/2020 Document Reviewed: 01/04/2020 Elsevier Patient Education  2022 [employer]  

## 2020-11-30 LAB — CMP14+EGFR
ALT: 35 [IU]/L (ref 0–44)
AST: 44 [IU]/L — ABNORMAL HIGH (ref 0–40)
Albumin/Globulin Ratio: 2.1 (ref 1.2–2.2)
Albumin: 4.5 g/dL (ref 4.1–5.2)
Alkaline Phosphatase: 130 [IU]/L — ABNORMAL HIGH (ref 44–121)
BUN/Creatinine Ratio: 18 (ref 9–20)
BUN: 21 mg/dL — ABNORMAL HIGH (ref 6–20)
Bilirubin Total: 0.2 mg/dL (ref 0.0–1.2)
CO2: 27 mmol/L (ref 20–29)
Calcium: 9.7 mg/dL (ref 8.7–10.2)
Chloride: 101 mmol/L (ref 96–106)
Creatinine, Ser: 1.19 mg/dL (ref 0.76–1.27)
Globulin, Total: 2.1 g/dL (ref 1.5–4.5)
Glucose: 59 mg/dL — ABNORMAL LOW (ref 65–99)
Potassium: 4.3 mmol/L (ref 3.5–5.2)
Sodium: 139 mmol/L (ref 134–144)
Total Protein: 6.6 g/dL (ref 6.0–8.5)
eGFR: 85 mL/min/{1.73_m2}

## 2020-11-30 LAB — TSH: TSH: 0.108 u[IU]/mL — ABNORMAL LOW (ref 0.450–4.500)

## 2020-12-02 ENCOUNTER — Other Ambulatory Visit: Payer: Self-pay | Admitting: Family

## 2020-12-02 MED ORDER — LEVOTHYROXINE SODIUM 175 MCG PO CAPS: 175.0000 ug | ORAL_CAPSULE | Freq: Every day | ORAL | 2 refills | Status: DC

## 2021-12-21 ENCOUNTER — Other Ambulatory Visit: Payer: Self-pay | Admitting: Family

## 2021-12-21 DIAGNOSIS — E039 Hypothyroidism, unspecified: Secondary | ICD-10-CM

## 2022-01-21 ENCOUNTER — Other Ambulatory Visit: Payer: Self-pay | Admitting: Family

## 2022-01-22 ENCOUNTER — Other Ambulatory Visit: Payer: Self-pay | Admitting: Family

## 2022-01-29 ENCOUNTER — Ambulatory Visit (INDEPENDENT_AMBULATORY_CARE_PROVIDER_SITE_OTHER): Payer: Self-pay | Admitting: Family

## 2022-01-29 ENCOUNTER — Encounter: Payer: Self-pay | Admitting: Family

## 2022-01-29 VITALS — BP 136/76 | HR 54 | Temp 97.7°F | Ht 70.0 in | Wt 200.6 lb

## 2022-01-29 DIAGNOSIS — R748 Abnormal levels of other serum enzymes: Secondary | ICD-10-CM

## 2022-01-29 DIAGNOSIS — E039 Hypothyroidism, unspecified: Secondary | ICD-10-CM

## 2022-01-29 MED ORDER — LEVOTHYROXINE SODIUM 200 MCG PO TABS: 200.0000 ug | ORAL_TABLET | Freq: Every morning | ORAL | 4 refills | Status: DC

## 2022-01-29 NOTE — Patient Instructions (Signed)

## 2022-01-29 NOTE — Progress Notes (Signed)
Subjective:    Patient ID: Shawn Lynch, male    DOB: [birth year], 31 y.o.   MRN: [id number]  Chief Complaint  Patient presents with   Medical Management of Chronic Issues    Patient states he felt better on 175 than the 200 of thyroid medication.    Pt presents to the office today for follow up thyroid. He has had liver enzymes elevated in the past. Denies alcohol or tylenol intake regularly.   He is requesting Testosterone checked today.  Thyroid Problem Presents for follow-up visit. Patient reports no anxiety, depressed mood, dry skin, fatigue or hoarse voice. The symptoms have been stable.      Review of Systems  Constitutional:  Negative for fatigue.  HENT:  Negative for hoarse voice.   Psychiatric/Behavioral:  The patient is not nervous/anxious.   All other systems reviewed and are negative.      Objective:   Physical Exam Vitals reviewed.  Constitutional:      General: He is not in acute distress.    Appearance: He is well-developed.  HENT:     Head: Normocephalic.     Right Ear: Tympanic membrane normal.     Left Ear: Tympanic membrane normal.  Eyes:     General:        Right eye: No discharge.        Left eye: No discharge.     Pupils: Pupils are equal, round, and reactive to light.  Neck:     Thyroid: No thyromegaly.  Cardiovascular:     Rate and Rhythm: Normal rate and regular rhythm.     Heart sounds: Normal heart sounds. No murmur heard. Pulmonary:     Effort: Pulmonary effort is normal. No respiratory distress.     Breath sounds: Normal breath sounds. No wheezing.  Abdominal:     General: Bowel sounds are normal. There is no distension.     Palpations: Abdomen is soft.     Tenderness: There is no abdominal tenderness.  Musculoskeletal:        General: No tenderness. Normal range of motion.     Cervical back: Normal range of motion and neck supple.  Skin:    General: Skin is warm and dry.     Findings: No erythema or rash.  Neurological:      Mental Status: He is alert and oriented to person, place, and time.     Cranial Nerves: No cranial nerve deficit.     Deep Tendon Reflexes: Reflexes are normal and symmetric.  Psychiatric:        Behavior: Behavior normal.        Thought Content: Thought content normal.        Judgment: Judgment normal.       BP 136/76   Pulse (!) 54   Temp 97.7 F (36.5 C) (Temporal)   Ht '5\' 10"'  (1.778 m)   Wt 200 lb 9.6 oz (91 kg)   SpO2 97%   BMI 28.78 kg/m      Assessment & Plan:  Shawn Lynch comes in today with chief complaint of Medical Management of Chronic Issues (Patient states he felt better on 175 than the 200 of thyroid medication. )   Diagnosis and orders addressed:  1. Acquired hypothyroidism - levothyroxine (SYNTHROID) 200 MCG tablet; Take 1 tablet (200 mcg total) by mouth every morning.  Dispense: 90 tablet; Refill: 4 - TSH - CMP14+EGFR - Testosterone,Free and Total; Future  2. Elevated liver enzymes - CMP14+EGFR -  Testosterone,Free and Total; Future   Labs pending Health Maintenance reviewed Diet and exercise encouraged  Follow up plan: 1 year    Evelina Dun, FNP

## 2022-01-30 LAB — CMP14+EGFR
ALT: 32 IU/L (ref 0–44)
AST: 48 IU/L — ABNORMAL HIGH (ref 0–40)
Albumin/Globulin Ratio: 2 (ref 1.2–2.2)
Albumin: 4.5 g/dL (ref 4.3–5.2)
Alkaline Phosphatase: 150 IU/L — ABNORMAL HIGH (ref 44–121)
BUN/Creatinine Ratio: 9 (ref 9–20)
BUN: 12 mg/dL (ref 6–20)
Bilirubin Total: 0.5 mg/dL (ref 0.0–1.2)
CO2: 25 mmol/L (ref 20–29)
Calcium: 9.2 mg/dL (ref 8.7–10.2)
Chloride: 102 mmol/L (ref 96–106)
Creatinine, Ser: 1.4 mg/dL — ABNORMAL HIGH (ref 0.76–1.27)
Globulin, Total: 2.3 g/dL (ref 1.5–4.5)
Glucose: 81 mg/dL (ref 70–99)
Potassium: 4.2 mmol/L (ref 3.5–5.2)
Sodium: 140 mmol/L (ref 134–144)
Total Protein: 6.8 g/dL (ref 6.0–8.5)
eGFR: 70 mL/min/{1.73_m2} (ref >59)

## 2022-01-30 LAB — TSH: TSH: 0.716 u[IU]/mL (ref 0.450–4.500)

## 2023-02-12 ENCOUNTER — Other Ambulatory Visit: Payer: Self-pay | Admitting: Family

## 2023-02-12 DIAGNOSIS — E039 Hypothyroidism, unspecified: Secondary | ICD-10-CM

## 2023-02-12 NOTE — Telephone Encounter (Signed)
[facility] NTBS Last TSH 01/29/22

## 2023-02-12 NOTE — Telephone Encounter (Signed)
I called pt to let him know that he will NTBS before he can get a refill on his thyroid meds bc he hasn't been seen in over a year. Pt hung up on me bc he said he was promised to get more refills.

## 2023-04-01 ENCOUNTER — Telehealth: Payer: Self-pay | Admitting: Family

## 2023-04-01 ENCOUNTER — Other Ambulatory Visit: Payer: Self-pay | Admitting: Family

## 2023-04-01 ENCOUNTER — Telehealth: Payer: Self-pay | Admitting: Family Medicine

## 2023-04-01 ENCOUNTER — Encounter: Payer: Self-pay | Admitting: Family

## 2023-04-01 ENCOUNTER — Other Ambulatory Visit: Payer: Self-pay

## 2023-04-01 DIAGNOSIS — E039 Hypothyroidism, unspecified: Secondary | ICD-10-CM

## 2023-04-01 MED ORDER — LEVOTHYROXINE SODIUM 200 MCG PO TABS: 200.0000 ug | ORAL_TABLET | Freq: Every morning | ORAL | 4 refills | Status: AC

## 2023-04-01 MED ORDER — LEVOTHYROXINE SODIUM 200 MCG PO TABS: 200.0000 ug | ORAL_TABLET | Freq: Every morning | ORAL | 0 refills | Status: DC

## 2023-04-01 NOTE — Telephone Encounter (Signed)
Copied from CRM [id number]. Topic: Clinical - Medication Question >> Apr 01, 2023  8:52 AM Fonda Kinder J wrote: Reason for CRM: Pt is out of medication and the next appointment isn't until 01/22. Pt wants to know if he can get an emergency refill or is he able to get a squeeze so that he can get a refill for his medication. Pt is requesting a callback [phone]

## 2023-04-01 NOTE — Telephone Encounter (Signed)
Patient aware and verbalized understanding. °

## 2023-04-01 NOTE — Progress Notes (Signed)
Virtual Visit Consent   Shawn Lynch, you are scheduled for a virtual visit with a [facility] provider today. Just as with appointments in the office, your consent must be obtained to participate. Your consent will be active for this visit and any virtual visit you may have with one of our providers in the next 365 days. If you have a MyChart account, a copy of this consent can be sent to you electronically.  As this is a virtual visit, video technology does not allow for your provider to perform a traditional examination. This may limit your provider's ability to fully assess your condition. If your provider identifies any concerns that need to be evaluated in person or the need to arrange testing (such as labs, EKG, etc.), we will make arrangements to do so. Although advances in technology are sophisticated, we cannot ensure that it will always work on either your end or our end. If the connection with a video visit is poor, the visit may have to be switched to a telephone visit. With either a video or telephone visit, we are not always able to ensure that we have a secure connection.  By engaging in this virtual visit, you consent to the provision of healthcare and authorize for your insurance to be billed (if applicable) for the services provided during this visit. Depending on your insurance coverage, you may receive a charge related to this service.  I need to obtain your verbal consent now. Are you willing to proceed with your visit today? DONEL MORDECAI has provided verbal consent on 04/01/2023 for a virtual visit (video or telephone). Jannifer Rodney, FNP  Date: 04/01/2023 12:16 PM  Virtual Visit via Video Note   I, Jannifer Rodney, connected with  Shawn Lynch  ([id number], March 18, 1991) on 04/01/23 at 12:10 PM EST by a video-enabled telemedicine application and verified that I am speaking with the correct person using two identifiers.  Location: Patient: Virtual Visit Location Patient:  Other: car Provider: Virtual Visit Location Provider: Home Office   I discussed the limitations of evaluation and management by telemedicine and the availability of in person appointments. The patient expressed understanding and agreed to proceed.    History of Present Illness: Shawn Lynch is a 32 y.o. who identifies as a male who was assigned male at birth, and is being seen today for chronic follow up.  HPI: Thyroid Problem Presents for follow-up visit. Symptoms include dry skin. Patient reports no anxiety, diarrhea, fatigue or hoarse voice. The symptoms have been stable.    Problems:  Patient Active Problem List   Diagnosis Date Noted   Candida rash of groin 04/17/2020   Hypothyroidism 11/21/2012    Allergies: No Known Allergies Medications:  Current Outpatient Medications:    levothyroxine (SYNTHROID) 200 MCG tablet, Take 1 tablet (200 mcg total) by mouth every morning., Disp: 90 tablet, Rfl: 4  Observations/Objective: Patient is well-developed, well-nourished in no acute distress.  Resting comfortably  Head is normocephalic, atraumatic.  No labored breathing.  Speech is clear and coherent with logical content.  Patient is alert and oriented at baseline.    Assessment and Plan: 1. Acquired hypothyroidism (Primary) - levothyroxine (SYNTHROID) 200 MCG tablet; Take 1 tablet (200 mcg total) by mouth every morning.  Dispense: 90 tablet; Refill: 4 - TSH; Future  Pt will come in have labs completed this week  Continue Levothyroxine 200 mcg  Healthy diet  and exercise  Follow up in 1 year   Follow Up  Instructions: I discussed the assessment and treatment plan with the patient. The patient was provided an opportunity to ask questions and all were answered. The patient agreed with the plan and demonstrated an understanding of the instructions.  A copy of instructions were sent to the patient via MyChart unless otherwise noted below.     The patient was advised to call back  or seek an in-person evaluation if the symptoms worsen or if the condition fails to improve as anticipated.    Jannifer Rodney, FNP

## 2023-04-01 NOTE — Telephone Encounter (Signed)
I sent in a 30 days of medication. Pt can do a virtual appointment today if he wishes and just come to the office in the next week for lab work.

## 2023-05-04 ENCOUNTER — Other Ambulatory Visit: Payer: Self-pay

## 2023-05-04 DIAGNOSIS — E039 Hypothyroidism, unspecified: Secondary | ICD-10-CM

## 2023-05-05 LAB — TSH: TSH: 3.57 u[IU]/mL (ref 0.450–4.500)
